# Patient Record
Sex: Female | Born: 1975 | Race: White | Hispanic: No | Marital: Single | Smoking: Current every day smoker
Health system: Southern US, Community
[De-identification: ages and names within clinical notes are randomized; demographics above are authoritative.]

## PROBLEM LIST (undated history)

## (undated) DIAGNOSIS — F1020 Alcohol dependence, uncomplicated: Secondary | ICD-10-CM

## (undated) DIAGNOSIS — K219 Gastro-esophageal reflux disease without esophagitis: Secondary | ICD-10-CM

## (undated) DIAGNOSIS — F329 Major depressive disorder, single episode, unspecified: Secondary | ICD-10-CM

## (undated) DIAGNOSIS — K2270 Barrett's esophagus without dysplasia: Secondary | ICD-10-CM

## (undated) DIAGNOSIS — F32A Depression, unspecified: Secondary | ICD-10-CM

## (undated) HISTORY — PX: CHOLECYSTECTOMY: SHX55

## (undated) HISTORY — PX: TONSILLECTOMY: SUR1361

## (undated) HISTORY — PX: NOSE SURGERY: SHX723

---

## 1998-11-02 ENCOUNTER — Other Ambulatory Visit: Admission: RE | Admit: 1998-11-02 | Discharge: 1998-11-02 | Payer: Self-pay | Admitting: Obstetrics & Gynecology

## 1998-12-08 ENCOUNTER — Inpatient Hospital Stay (HOSPITAL_COMMUNITY): Admission: AD | Admit: 1998-12-08 | Discharge: 1998-12-11 | Payer: Self-pay | Admitting: Obstetrics & Gynecology

## 1998-12-22 ENCOUNTER — Emergency Department (HOSPITAL_COMMUNITY): Admission: EM | Admit: 1998-12-22 | Discharge: 1998-12-22 | Payer: Self-pay | Admitting: Emergency Medicine

## 1998-12-30 ENCOUNTER — Emergency Department (HOSPITAL_COMMUNITY): Admission: EM | Admit: 1998-12-30 | Discharge: 1998-12-30 | Payer: Self-pay | Admitting: Emergency Medicine

## 1999-03-16 ENCOUNTER — Emergency Department (HOSPITAL_COMMUNITY): Admission: EM | Admit: 1999-03-16 | Discharge: 1999-03-16 | Payer: Self-pay | Admitting: Emergency Medicine

## 2001-11-26 ENCOUNTER — Emergency Department (HOSPITAL_COMMUNITY): Admission: EM | Admit: 2001-11-26 | Discharge: 2001-11-26 | Payer: Self-pay | Admitting: Emergency Medicine

## 2001-11-26 ENCOUNTER — Encounter: Payer: Self-pay | Admitting: Emergency Medicine

## 2002-05-24 ENCOUNTER — Emergency Department (HOSPITAL_COMMUNITY): Admission: EM | Admit: 2002-05-24 | Discharge: 2002-05-24 | Payer: Self-pay | Admitting: Emergency Medicine

## 2002-10-17 ENCOUNTER — Ambulatory Visit (HOSPITAL_COMMUNITY): Admission: RE | Admit: 2002-10-17 | Discharge: 2002-10-17 | Payer: Self-pay | Admitting: *Deleted

## 2003-09-29 ENCOUNTER — Emergency Department (HOSPITAL_COMMUNITY): Admission: EM | Admit: 2003-09-29 | Discharge: 2003-09-29 | Payer: Self-pay | Admitting: Family Medicine

## 2003-11-07 ENCOUNTER — Encounter: Admission: RE | Admit: 2003-11-07 | Discharge: 2003-11-07 | Payer: Self-pay | Admitting: *Deleted

## 2003-11-09 ENCOUNTER — Emergency Department (HOSPITAL_COMMUNITY): Admission: EM | Admit: 2003-11-09 | Discharge: 2003-11-09 | Payer: Self-pay | Admitting: Family Medicine

## 2003-11-18 ENCOUNTER — Encounter (INDEPENDENT_AMBULATORY_CARE_PROVIDER_SITE_OTHER): Payer: Self-pay | Admitting: Specialist

## 2003-11-18 ENCOUNTER — Ambulatory Visit (HOSPITAL_COMMUNITY): Admission: RE | Admit: 2003-11-18 | Discharge: 2003-11-18 | Payer: Self-pay | Admitting: *Deleted

## 2003-12-27 ENCOUNTER — Emergency Department (HOSPITAL_COMMUNITY): Admission: EM | Admit: 2003-12-27 | Discharge: 2003-12-27 | Payer: Self-pay | Admitting: Emergency Medicine

## 2003-12-31 ENCOUNTER — Encounter: Admission: RE | Admit: 2003-12-31 | Discharge: 2003-12-31 | Payer: Self-pay | Admitting: Internal Medicine

## 2006-01-07 ENCOUNTER — Emergency Department (HOSPITAL_COMMUNITY): Admission: EM | Admit: 2006-01-07 | Discharge: 2006-01-07 | Payer: Self-pay | Admitting: Emergency Medicine

## 2006-04-10 ENCOUNTER — Emergency Department (HOSPITAL_COMMUNITY): Admission: EM | Admit: 2006-04-10 | Discharge: 2006-04-10 | Payer: Self-pay | Admitting: Emergency Medicine

## 2006-09-23 ENCOUNTER — Inpatient Hospital Stay (HOSPITAL_COMMUNITY): Admission: AD | Admit: 2006-09-23 | Discharge: 2006-09-27 | Payer: Self-pay | Admitting: Psychiatry

## 2006-09-23 ENCOUNTER — Ambulatory Visit: Payer: Self-pay | Admitting: Psychiatry

## 2006-10-12 ENCOUNTER — Emergency Department (HOSPITAL_COMMUNITY): Admission: EM | Admit: 2006-10-12 | Discharge: 2006-10-12 | Payer: Self-pay | Admitting: Family Medicine

## 2007-03-10 ENCOUNTER — Emergency Department (HOSPITAL_COMMUNITY): Admission: EM | Admit: 2007-03-10 | Discharge: 2007-03-10 | Payer: Self-pay | Admitting: Emergency Medicine

## 2007-04-15 ENCOUNTER — Emergency Department (HOSPITAL_COMMUNITY): Admission: EM | Admit: 2007-04-15 | Discharge: 2007-04-15 | Payer: Self-pay | Admitting: Emergency Medicine

## 2007-10-21 ENCOUNTER — Emergency Department (HOSPITAL_COMMUNITY): Admission: EM | Admit: 2007-10-21 | Discharge: 2007-10-21 | Payer: Self-pay | Admitting: Family Medicine

## 2008-02-05 ENCOUNTER — Emergency Department (HOSPITAL_COMMUNITY): Admission: EM | Admit: 2008-02-05 | Discharge: 2008-02-05 | Payer: Self-pay | Admitting: Family Medicine

## 2008-03-09 IMAGING — CR DG CHEST 2V
2 series · 2 of 2 positions shown · non-contrast
Comparison: 03/10/2007.

PA and lateral chest, 04/15/2007.
INDICATION: Fever , vomiting.

[w chest pa]
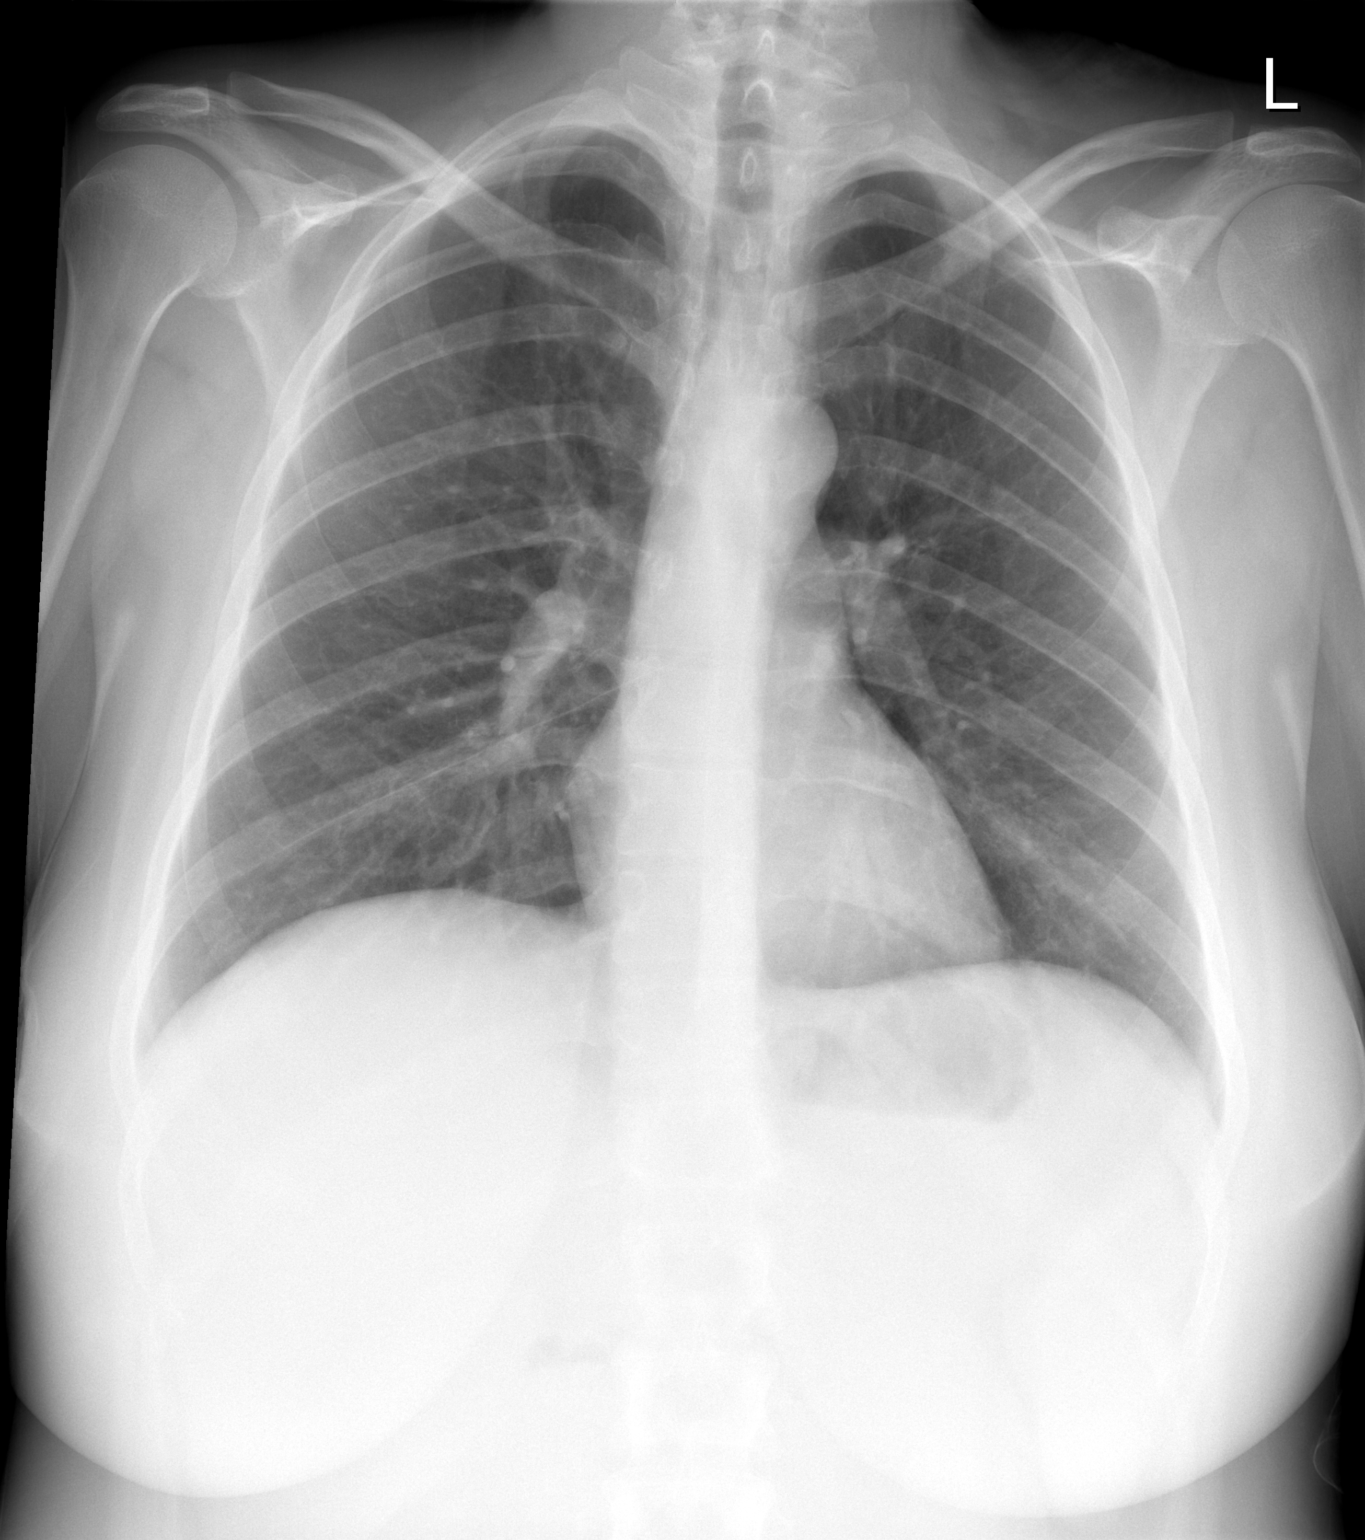

[w chest lat]
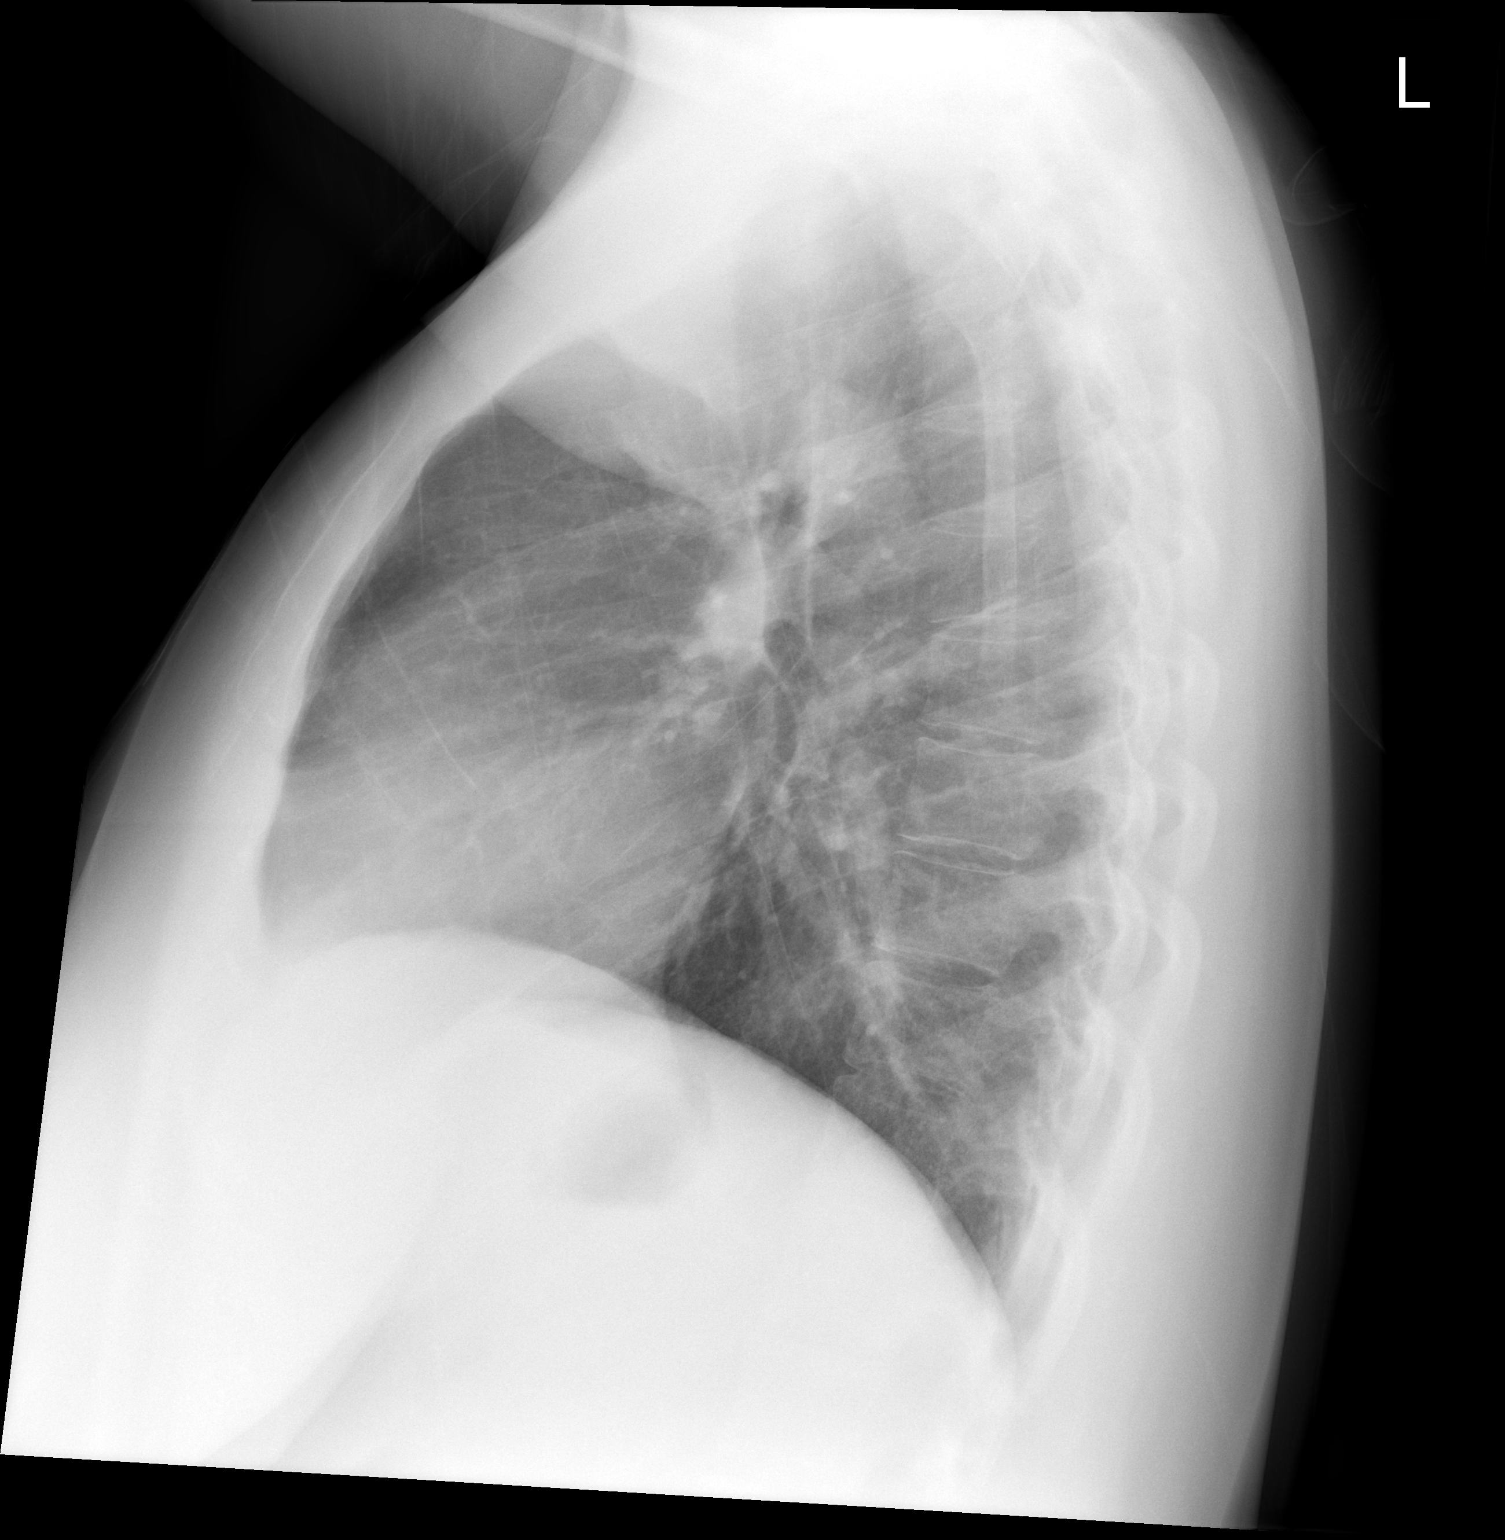

[2 of 2 positions shown; findings below may reference images not displayed]

FINDINGS: Lungs clear. Cardiopericardial silhouette within normal limits. No
edema, effusions, or airspace disease.
IMPRESSION: 1. No active cardiopulmonary disease.

## 2008-07-16 ENCOUNTER — Emergency Department (HOSPITAL_COMMUNITY): Admission: EM | Admit: 2008-07-16 | Discharge: 2008-07-16 | Payer: Self-pay | Admitting: Family Medicine

## 2009-01-29 ENCOUNTER — Emergency Department (HOSPITAL_COMMUNITY): Admission: EM | Admit: 2009-01-29 | Discharge: 2009-01-29 | Payer: Self-pay | Admitting: Emergency Medicine

## 2009-02-10 ENCOUNTER — Emergency Department (HOSPITAL_COMMUNITY): Admission: EM | Admit: 2009-02-10 | Discharge: 2009-02-10 | Payer: Self-pay | Admitting: Emergency Medicine

## 2009-05-04 ENCOUNTER — Emergency Department (HOSPITAL_COMMUNITY): Admission: EM | Admit: 2009-05-04 | Discharge: 2009-05-04 | Payer: Self-pay | Admitting: Emergency Medicine

## 2009-05-05 ENCOUNTER — Emergency Department (HOSPITAL_COMMUNITY): Admission: EM | Admit: 2009-05-05 | Discharge: 2009-05-05 | Payer: Self-pay | Admitting: Emergency Medicine

## 2009-05-24 ENCOUNTER — Emergency Department (HOSPITAL_BASED_OUTPATIENT_CLINIC_OR_DEPARTMENT_OTHER): Admission: EM | Admit: 2009-05-24 | Discharge: 2009-05-24 | Payer: Self-pay | Admitting: Emergency Medicine

## 2009-06-29 ENCOUNTER — Emergency Department (HOSPITAL_COMMUNITY): Admission: EM | Admit: 2009-06-29 | Discharge: 2009-06-29 | Payer: Self-pay | Admitting: Emergency Medicine

## 2009-07-11 ENCOUNTER — Emergency Department (HOSPITAL_COMMUNITY): Admission: EM | Admit: 2009-07-11 | Discharge: 2009-07-12 | Payer: Self-pay | Admitting: Emergency Medicine

## 2009-07-12 ENCOUNTER — Inpatient Hospital Stay: Payer: Self-pay | Admitting: Psychiatry

## 2009-11-11 ENCOUNTER — Emergency Department (HOSPITAL_COMMUNITY): Admission: EM | Admit: 2009-11-11 | Discharge: 2009-11-12 | Payer: Self-pay | Admitting: Emergency Medicine

## 2009-11-12 ENCOUNTER — Ambulatory Visit: Payer: Self-pay | Admitting: Psychiatry

## 2009-11-12 ENCOUNTER — Inpatient Hospital Stay (HOSPITAL_COMMUNITY): Admission: EM | Admit: 2009-11-12 | Discharge: 2009-11-21 | Payer: Self-pay | Admitting: Psychiatry

## 2009-12-28 ENCOUNTER — Ambulatory Visit: Payer: Self-pay | Admitting: Psychiatry

## 2010-01-13 ENCOUNTER — Emergency Department (HOSPITAL_BASED_OUTPATIENT_CLINIC_OR_DEPARTMENT_OTHER): Admission: EM | Admit: 2010-01-13 | Discharge: 2010-01-13 | Payer: Self-pay | Admitting: Emergency Medicine

## 2010-06-12 ENCOUNTER — Encounter: Payer: Self-pay | Admitting: Internal Medicine

## 2010-06-13 ENCOUNTER — Encounter: Payer: Self-pay | Admitting: Internal Medicine

## 2010-08-06 LAB — URINALYSIS, ROUTINE W REFLEX MICROSCOPIC
Glucose, UA: NEGATIVE mg/dL
Ketones, ur: 40 mg/dL — AB
Leukocytes, UA: NEGATIVE
Specific Gravity, Urine: 1.019 (ref 1.005–1.030)
Urobilinogen, UA: 0.2 mg/dL (ref 0.0–1.0)
pH: 8.5 — ABNORMAL HIGH (ref 5.0–8.0)

## 2010-08-06 LAB — BASIC METABOLIC PANEL
Calcium: 8.6 mg/dL (ref 8.4–10.5)
Creatinine, Ser: 0.8 mg/dL (ref 0.4–1.2)
GFR calc Af Amer: >60 mL/min (ref >60)
GFR calc non Af Amer: >60 mL/min (ref >60)
Glucose, Bld: 61 mg/dL — ABNORMAL LOW (ref 70–99)

## 2010-08-06 LAB — CBC
Hemoglobin: 15.3 g/dL — ABNORMAL HIGH (ref 12.0–15.0)
MCH: 33 pg (ref 26.0–34.0)
MCHC: 34.4 g/dL (ref 30.0–36.0)
MCV: 96 fL (ref 78.0–100.0)
RBC: 4.62 MIL/uL (ref 3.87–5.11)
RDW: 13.3 % (ref 11.5–15.5)

## 2010-08-06 LAB — POCT TOXICOLOGY PANEL

## 2010-08-06 LAB — DIFFERENTIAL
Basophils Relative: 1 % (ref 0–1)
Lymphocytes Relative: 17 % (ref 12–46)
Monocytes Relative: 6 % (ref 3–12)
Neutro Abs: 10.3 10*3/uL — ABNORMAL HIGH (ref 1.7–7.7)

## 2010-08-06 LAB — URINE MICROSCOPIC-ADD ON

## 2010-08-06 LAB — PREGNANCY, URINE: Preg Test, Ur: NEGATIVE

## 2010-08-08 LAB — CBC
Hemoglobin: 14.1 g/dL (ref 12.0–15.0)
Hemoglobin: 14.2 g/dL (ref 12.0–15.0)
MCV: 97.7 fL (ref 78.0–100.0)
MCV: 96.6 fL (ref 78.0–100.0)
Platelets: 256 10*3/uL (ref 150–400)
RBC: 4.46 MIL/uL (ref 3.87–5.11)
RDW: 12.7 % (ref 11.5–15.5)
RDW: 13.3 % (ref 11.5–15.5)

## 2010-08-08 LAB — BASIC METABOLIC PANEL
BUN: 7 mg/dL (ref 6–23)
BUN: 2 mg/dL — ABNORMAL LOW (ref 6–23)
CO2: 30 mEq/L (ref 19–32)
Calcium: 9.7 mg/dL (ref 8.4–10.5)
Calcium: 8.8 mg/dL (ref 8.4–10.5)
Chloride: 96 mEq/L (ref 96–112)
GFR calc non Af Amer: >60 mL/min (ref >60)
GFR calc non Af Amer: >60 mL/min (ref >60)
Glucose, Bld: 84 mg/dL (ref 70–99)
Potassium: 2.3 mEq/L — CL (ref 3.5–5.1)
Potassium: 2.5 mEq/L — CL (ref 3.5–5.1)
Sodium: 140 mEq/L (ref 135–145)
Sodium: 138 mEq/L (ref 135–145)

## 2010-08-08 LAB — URINALYSIS, ROUTINE W REFLEX MICROSCOPIC
Glucose, UA: NEGATIVE mg/dL
Ketones, ur: 40 mg/dL — AB
Protein, ur: 30 mg/dL — AB
Urobilinogen, UA: 1 mg/dL (ref 0.0–1.0)
pH: 7.5 (ref 5.0–8.0)

## 2010-08-08 LAB — HEPATIC FUNCTION PANEL
AST: 27 U/L (ref 0–37)
Bilirubin, Direct: 0.2 mg/dL (ref 0.0–0.3)
Total Bilirubin: 0.6 mg/dL (ref 0.3–1.2)

## 2010-08-08 LAB — DIFFERENTIAL
Basophils Absolute: 0 10*3/uL (ref 0.0–0.1)
Basophils Relative: 1 % (ref 0–1)
Eosinophils Absolute: 0 10*3/uL (ref 0.0–0.7)
Lymphocytes Relative: 41 % (ref 12–46)
Lymphs Abs: 2.5 10*3/uL (ref 0.7–4.0)
Monocytes Absolute: 0.4 10*3/uL (ref 0.1–1.0)
Monocytes Absolute: 0.4 10*3/uL (ref 0.1–1.0)
Monocytes Relative: 7 % (ref 3–12)
Neutro Abs: 4.7 10*3/uL (ref 1.7–7.7)
Neutro Abs: 3.3 10*3/uL (ref 1.7–7.7)
Neutrophils Relative %: 64 % (ref 43–77)

## 2010-08-08 LAB — URINE MICROSCOPIC-ADD ON

## 2010-08-08 LAB — GC/CHLAMYDIA PROBE AMP, URINE: GC Probe Amp, Urine: NEGATIVE

## 2010-08-08 LAB — TSH
TSH: 0.617 u[IU]/mL (ref 0.350–4.500)
TSH: 1.211 u[IU]/mL (ref 0.350–4.500)

## 2010-08-08 LAB — POTASSIUM
Potassium: 3 mEq/L — ABNORMAL LOW (ref 3.5–5.1)
Potassium: 3.3 mEq/L — ABNORMAL LOW (ref 3.5–5.1)

## 2010-08-08 LAB — ACETAMINOPHEN LEVEL: Acetaminophen (Tylenol), Serum: <10 ug/mL — ABNORMAL LOW (ref 10–30)

## 2010-08-08 LAB — TRICYCLICS SCREEN, URINE: TCA Scrn: NOT DETECTED

## 2010-08-08 LAB — RAPID URINE DRUG SCREEN, HOSP PERFORMED
Benzodiazepines: POSITIVE — AB
Tetrahydrocannabinol: POSITIVE — AB

## 2010-08-08 LAB — ETHANOL: Alcohol, Ethyl (B): <5 mg/dL (ref 0–10)

## 2010-08-08 LAB — POCT PREGNANCY, URINE: Preg Test, Ur: NEGATIVE

## 2010-08-08 LAB — BLOOD GAS, ARTERIAL: TCO2: 26.1 mmol/L (ref 0–100)

## 2010-08-11 LAB — CBC
HCT: 41.9 % (ref 36.0–46.0)
HCT: 46.6 % — ABNORMAL HIGH (ref 36.0–46.0)
Hemoglobin: 14.9 g/dL (ref 12.0–15.0)
MCHC: 34.6 g/dL (ref 30.0–36.0)
MCHC: 35.5 g/dL (ref 30.0–36.0)
MCV: 97 fL (ref 78.0–100.0)
MCV: 98.3 fL (ref 78.0–100.0)
Platelets: 226 10*3/uL (ref 150–400)
RBC: 4.74 MIL/uL (ref 3.87–5.11)
RDW: 14.5 % (ref 11.5–15.5)
WBC: 7.1 10*3/uL (ref 4.0–10.5)

## 2010-08-11 LAB — DIFFERENTIAL
Basophils Relative: 1 % (ref 0–1)
Eosinophils Absolute: 0 10*3/uL (ref 0.0–0.7)
Eosinophils Relative: 0 % (ref 0–5)
Lymphs Abs: 2.8 10*3/uL (ref 0.7–4.0)
Monocytes Relative: 5 % (ref 3–12)

## 2010-08-11 LAB — LIPASE, BLOOD: Lipase: 61 U/L — ABNORMAL HIGH (ref 11–59)

## 2010-08-11 LAB — COMPREHENSIVE METABOLIC PANEL
Albumin: 3.9 g/dL (ref 3.5–5.2)
Alkaline Phosphatase: 46 U/L (ref 39–117)
BUN: 7 mg/dL (ref 6–23)
Creatinine, Ser: 0.59 mg/dL (ref 0.4–1.2)
Glucose, Bld: 90 mg/dL (ref 70–99)
Potassium: 2.2 mEq/L — CL (ref 3.5–5.1)
Total Bilirubin: 1 mg/dL (ref 0.3–1.2)
Total Protein: 7.3 g/dL (ref 6.0–8.3)

## 2010-08-11 LAB — RAPID URINE DRUG SCREEN, HOSP PERFORMED
Barbiturates: NOT DETECTED
Barbiturates: NOT DETECTED
Benzodiazepines: NOT DETECTED
Benzodiazepines: POSITIVE — AB
Cocaine: NOT DETECTED
Opiates: NOT DETECTED
Opiates: NOT DETECTED

## 2010-08-11 LAB — URINALYSIS, ROUTINE W REFLEX MICROSCOPIC
Glucose, UA: NEGATIVE mg/dL
Ketones, ur: 40 mg/dL — AB
Protein, ur: NEGATIVE mg/dL
pH: 6 (ref 5.0–8.0)

## 2010-08-11 LAB — POCT I-STAT, CHEM 8
Calcium, Ion: 0.98 mmol/L — ABNORMAL LOW (ref 1.12–1.32)
Creatinine, Ser: 0.7 mg/dL (ref 0.4–1.2)
Glucose, Bld: 88 mg/dL (ref 70–99)
Hemoglobin: 16.7 g/dL — ABNORMAL HIGH (ref 12.0–15.0)
TCO2: 23 mmol/L (ref 0–100)

## 2010-08-11 LAB — URINE MICROSCOPIC-ADD ON

## 2010-08-11 LAB — ETHANOL: Alcohol, Ethyl (B): 5 mg/dL (ref 0–10)

## 2010-08-27 LAB — POCT I-STAT, CHEM 8
BUN: <3 mg/dL — ABNORMAL LOW (ref 6–23)
BUN: <3 mg/dL — ABNORMAL LOW (ref 6–23)
Calcium, Ion: 1.07 mmol/L — ABNORMAL LOW (ref 1.12–1.32)
Calcium, Ion: 0.91 mmol/L — ABNORMAL LOW (ref 1.12–1.32)
Calcium, Ion: 0.94 mmol/L — ABNORMAL LOW (ref 1.12–1.32)
Chloride: 91 mEq/L — ABNORMAL LOW (ref 96–112)
Chloride: 90 mEq/L — ABNORMAL LOW (ref 96–112)
Creatinine, Ser: 0.6 mg/dL (ref 0.4–1.2)
Creatinine, Ser: 0.7 mg/dL (ref 0.4–1.2)
Glucose, Bld: 95 mg/dL (ref 70–99)
Glucose, Bld: 141 mg/dL — ABNORMAL HIGH (ref 70–99)
Glucose, Bld: 89 mg/dL (ref 70–99)
HCT: 45 % (ref 36.0–46.0)
Hemoglobin: 15.3 g/dL — ABNORMAL HIGH (ref 12.0–15.0)
TCO2: 34 mmol/L (ref 0–100)

## 2010-08-27 LAB — CBC
HCT: 41.3 % (ref 36.0–46.0)
Hemoglobin: 15.9 g/dL — ABNORMAL HIGH (ref 12.0–15.0)
MCHC: 34.8 g/dL (ref 30.0–36.0)
MCHC: 34.5 g/dL (ref 30.0–36.0)
MCV: 99.5 fL (ref 78.0–100.0)
Platelets: 181 10*3/uL (ref 150–400)
RBC: 4.62 MIL/uL (ref 3.87–5.11)
RDW: 15.1 % (ref 11.5–15.5)

## 2010-08-27 LAB — POCT CARDIAC MARKERS
CKMB, poc: 5.9 ng/mL (ref 1.0–8.0)
Myoglobin, poc: 138 ng/mL (ref 12–200)
Troponin i, poc: <0.05 ng/mL (ref 0.00–0.09)

## 2010-08-27 LAB — URINE MICROSCOPIC-ADD ON

## 2010-08-27 LAB — SALICYLATE LEVEL: Salicylate Lvl: <4 mg/dL (ref 2.8–20.0)

## 2010-08-27 LAB — URINALYSIS, ROUTINE W REFLEX MICROSCOPIC
Bilirubin Urine: NEGATIVE
Glucose, UA: NEGATIVE mg/dL
Hgb urine dipstick: NEGATIVE
Ketones, ur: 15 mg/dL — AB
Ketones, ur: NEGATIVE mg/dL
Nitrite: NEGATIVE
Protein, ur: 100 mg/dL — AB
Specific Gravity, Urine: 1.016 (ref 1.005–1.030)
Urobilinogen, UA: 1 mg/dL (ref 0.0–1.0)

## 2010-08-27 LAB — DIFFERENTIAL
Basophils Absolute: 0 10*3/uL (ref 0.0–0.1)
Basophils Absolute: 0.1 10*3/uL (ref 0.0–0.1)
Basophils Relative: 0 % (ref 0–1)
Basophils Relative: 1 % (ref 0–1)
Eosinophils Absolute: 0.1 10*3/uL (ref 0.0–0.7)
Eosinophils Relative: 0 % (ref 0–5)
Lymphocytes Relative: 12 % (ref 12–46)
Lymphs Abs: 5.6 10*3/uL — ABNORMAL HIGH (ref 0.7–4.0)
Neutro Abs: 8.5 10*3/uL — ABNORMAL HIGH (ref 1.7–7.7)
Neutrophils Relative %: 59 % (ref 43–77)

## 2010-08-27 LAB — D-DIMER, QUANTITATIVE: D-Dimer, Quant: 0.45 ug/mL-FEU (ref 0.00–0.48)

## 2010-08-27 LAB — URINE CULTURE: Colony Count: NO GROWTH

## 2010-08-27 LAB — COMPREHENSIVE METABOLIC PANEL
ALT: <8 U/L (ref 0–35)
CO2: 30 mEq/L (ref 19–32)
Calcium: 9.6 mg/dL (ref 8.4–10.5)
GFR calc non Af Amer: >60 mL/min (ref >60)
Glucose, Bld: 138 mg/dL — ABNORMAL HIGH (ref 70–99)
Sodium: 143 mEq/L (ref 135–145)

## 2010-08-27 LAB — LIPASE, BLOOD: Lipase: 36 U/L (ref 11–59)

## 2010-08-27 LAB — ETHANOL: Alcohol, Ethyl (B): <5 mg/dL (ref 0–10)

## 2010-08-27 LAB — RAPID URINE DRUG SCREEN, HOSP PERFORMED
Amphetamines: NOT DETECTED
Benzodiazepines: POSITIVE — AB
Cocaine: NOT DETECTED
Opiates: NOT DETECTED
Tetrahydrocannabinol: NOT DETECTED
Tetrahydrocannabinol: POSITIVE — AB

## 2010-08-27 LAB — CK TOTAL AND CKMB (NOT AT ARMC): Relative Index: 0.9 (ref 0.0–2.5)

## 2010-10-05 NOTE — Discharge Summary (Signed)
NAMEMarland Kitchen  HOLLIDAY, SHEAFFER          ACCOUNT NO.:  [id number]   MEDICAL RECORD NO.:  [id number]          PATIENT TYPE:  IPS   LOCATION:  0307                          FACILITY:  [facility]   PHYSICIAN:  Anselm Jungling, MD  DATE OF BIRTH:  11-13-1975   DATE OF ADMISSION:  09/23/2006  DATE OF DISCHARGE:  09/27/2006                               DISCHARGE SUMMARY   IDENTIFYING DATA AND REASON FOR ADMISSION:  The patient is a 35 year old  single white female who initially presented to the emergency department  at [facility] in the company of the [facility]  Department.  Her son had called 9-1-1.  She had been drinking alcohol  heavily, and her alcohol level on admission was 267.  She had scratched  her left wrist few days prior and all of this was frightening to her  son.  She had a DWI a year prior and her license had been revoked.  Please refer to the admission note for further details pertaining to the  symptoms, circumstances and history that led to her hospitalization.   She was given initial Axis I diagnosis of polysubstance dependence, and  depressive disorder, not otherwise specified.   MEDICAL AND LABORATORY:  The patient was medically and physically  assessed both in the emergency room, and by our psychiatric nurse  practitioner upon admission to the inpatient service.  She was in  generally good health, with the exception of hiatal hernia.  There were  no acute medical issues.   HOSPITAL COURSE:  The patient was admitted to the adult inpatient  psychiatric service.  She presented as a well-nourished, well-developed  woman who was alert, fully oriented and without any signs, or symptoms  of psychosis or thought disorder.   She was placed on a Librium withdrawal protocol to assist with alcohol  cessation.  In addition, she was started on Campral  666 mg t.i.d. for  alcohol cessation.  She was placed briefly on a trial of Lamictal 25 mg  daily, but this was  discontinued after 1 day, as it did not appear to be  in the best interest of the overall treatment plan.   The patient was involved in various therapeutic groups and activities,  including those geared towards 12-step recovery.  She missed groups on  the third hospital day due to her complaints of nausea, vomiting and  abdominal cramping with diarrhea.  It was in part the symptoms that led  to the discontinuation of Lamictal, which might have been in part  responsible for them.   On the third hospital day, there was also a family session with the  patient's parents.  Discharge planning and aftercare needs were  discussed at length.  The patient's family was generally supportive.   The patient continued with better participation over the past 3 days of  her hospital stay.  She completed the Librium protocol and from then on,  did not receive any further Librium.   She appeared appropriate for discharge on the fifth hospital day.  She  agreed to the following aftercare plan.   AFTERCARE:  The patient was  to follow-up at the [facility], and was  to go there for intake directly following her discharge.  She was also  given referral information for [facility].   DISCHARGE MEDICATIONS:  Campral 666 mg t.i.d.   DISCHARGE DIAGNOSES:  AXIS I: Alcohol dependence, early remission.  AXIS II: Deferred.  AXIS III: History of hiatal hernia.  AXIS IV: Stressors severe.  AXIS V: GAF on discharge 65.      Anselm Jungling, MD  Electronically Signed     SPB/MEDQ  D:  10/02/2006  T:  10/02/2006  Job:  [id number]

## 2010-10-08 NOTE — Op Note (Signed)
NAME:  Melody Alexander, Melody Alexander                    ACCOUNT NO.:  [id number]   MEDICAL RECORD NO.:  [id number]                   PATIENT TYPE:  AMB   LOCATION:  ENDO                                 FACILITY:  [facility]   PHYSICIAN:  Sharyn Dross., M.D.               DATE OF BIRTH:  1975-06-27   DATE OF PROCEDURE:  10/17/2002  DATE OF DISCHARGE:                                 OPERATIVE REPORT   PREOPERATIVE DIAGNOSIS:  Gastroesophageal reflux disease; rule out erosive  esophagitis, rule out possible Barrett's esophagus.   POSTOPERATIVE DIAGNOSIS:  1. Grade 1 LA classification (A) esophagitis.  2. Lax lower esophageal sphincter area.   PROCEDURE:  Esophagogastroduodenoscopy.   ENDOSCOPIST:  Dortha Kern, M.D.   MEDICATIONS:  Demerol 100 mg IV, Versed 10 mg IV over a 10 minute period of  time.   INSTRUMENTS:  Olympus video panendoscope.   INFORMED CONSENT:  The patient was advised of the procedure, indications and  the risks involved.  The video was reviewed and consent form obtained.   DESCRIPTION OF PROCEDURE:  The patient was brought to the [facility]  Endoscopy Unit where the IV sedating medication was started.  A monitor was  placed on the patient to monitor the patient's vital signs and oxygen  saturation.  Nasal oxygen at 2 liters per minute was used, and after  adequate sedation was performed, the procedure was begun.   The instrument was advanced with the patient lying in the left lateral  position under direct technique without difficulty.  The oropharyngeal,  epiglottis and vocal cords appeared to be within normal limits.  The  esophagus was normal without any evidence of acute inflammation, ulcerations  or varices appreciated.  There was a relaxed lower esophageal sphincter area  that was noted.  At the cardia region, there was noticed evidence of LA  classification, grade A esophagitis that was present at this time.  There  were no other abnormalities appreciated  otherwise.   The gastric area showed a normal mucous lake without any evidence of acute  inflammation or ulceration that was noted.  The antral area was normal, and  the pylorus appeared to be normal without any deformities appreciated.  On  advancing to the pyloric canal, the duodenal bulb and second portion  appeared to be within normal limits.  Photographs were taken of these  regions.   The instrument was retracted back.  A retroflexed view of the cardia showed  relaxed lower esophageal sphincter area that was present.  The instrument  was retracted back.  The Z-line appeared to be 34 cm distal esophagus that  was noted.  The instrument was again retracted back into the esophageal area  where a closer view of the inflammatory lesions in the cardia were noted.  Again, photographs were taken of this region.  The instrument was  subsequently retracted back into the esophagus and removed per ora without  difficulty where  the patient tolerated the procedure well.   TREATMENT:  1. Continue the present PPI b.i.d.  2. Will add Reglan 10 mg q.12h. to the regimen.  3. We will have the patient follow up with me in two weeks for re-evaluation     and to discuss the results of the procedure.                                               Sharyn Dross., M.D.    JM/MEDQ  D:  10/17/2002  T:  10/17/2002  Job:  [id number]
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
## 2011-02-11 ENCOUNTER — Emergency Department (HOSPITAL_COMMUNITY)
Admission: EM | Admit: 2011-02-11 | Discharge: 2011-02-11 | Disposition: A | Discharge status: Home / Self Care | Payer: Medicaid Other | Attending: Emergency Medicine | Admitting: Emergency Medicine

## 2011-02-11 DIAGNOSIS — R259 Unspecified abnormal involuntary movements: Secondary | ICD-10-CM | POA: Insufficient documentation

## 2011-02-11 DIAGNOSIS — R252 Cramp and spasm: Secondary | ICD-10-CM | POA: Insufficient documentation

## 2011-02-11 DIAGNOSIS — K219 Gastro-esophageal reflux disease without esophagitis: Secondary | ICD-10-CM | POA: Insufficient documentation

## 2011-02-11 DIAGNOSIS — R112 Nausea with vomiting, unspecified: Secondary | ICD-10-CM | POA: Insufficient documentation

## 2011-02-11 LAB — POCT I-STAT, CHEM 8
BUN: 12 mg/dL (ref 6–23)
Calcium, Ion: 1.13 mmol/L (ref 1.12–1.32)
Chloride: 106 mEq/L (ref 96–112)
Glucose, Bld: 89 mg/dL (ref 70–99)
HCT: 46 % (ref 36.0–46.0)
Potassium: 3.5 mEq/L (ref 3.5–5.1)

## 2011-02-11 LAB — CBC
HCT: 39.5 % (ref 36.0–46.0)
MCV: 86.8 fL (ref 78.0–100.0)
RBC: 4.55 MIL/uL (ref 3.87–5.11)
RDW: 12.8 % (ref 11.5–15.5)
WBC: 17 10*3/uL — ABNORMAL HIGH (ref 4.0–10.5)

## 2011-02-11 LAB — DIFFERENTIAL
Basophils Absolute: 0.1 10*3/uL (ref 0.0–0.1)
Eosinophils Relative: 0 % (ref 0–5)
Lymphocytes Relative: 11 % — ABNORMAL LOW (ref 12–46)
Lymphs Abs: 1.9 10*3/uL (ref 0.7–4.0)
Neutro Abs: 14.4 10*3/uL — ABNORMAL HIGH (ref 1.7–7.7)
Neutrophils Relative %: 84 % — ABNORMAL HIGH (ref 43–77)

## 2011-02-11 LAB — POCT PREGNANCY, URINE: Preg Test, Ur: NEGATIVE

## 2011-02-11 LAB — URINE MICROSCOPIC-ADD ON

## 2011-02-11 LAB — URINALYSIS, ROUTINE W REFLEX MICROSCOPIC
Hgb urine dipstick: NEGATIVE
Nitrite: NEGATIVE
Specific Gravity, Urine: 1.018 (ref 1.005–1.030)
Urobilinogen, UA: 0.2 mg/dL (ref 0.0–1.0)
pH: 7 (ref 5.0–8.0)

## 2011-02-16 LAB — POCT URINALYSIS DIP (DEVICE)
Glucose, UA: NEGATIVE
Specific Gravity, Urine: 1.025
pH: 6

## 2011-02-16 LAB — WET PREP, GENITAL: Clue Cells Wet Prep HPF POC: NONE SEEN

## 2011-02-16 LAB — HEPATITIS B CORE ANTIBODY, TOTAL: Hep B Core Total Ab: NEGATIVE

## 2011-02-16 LAB — HEPATITIS B SURFACE ANTIGEN: Hepatitis B Surface Ag: NEGATIVE

## 2011-02-16 LAB — GC/CHLAMYDIA PROBE AMP, GENITAL
Chlamydia, DNA Probe: POSITIVE — AB
GC Probe Amp, Genital: NEGATIVE

## 2011-02-16 LAB — HEPATITIS C ANTIBODY: HCV Ab: NEGATIVE

## 2011-02-16 LAB — URINE CULTURE

## 2011-02-16 LAB — HEPATITIS B SURFACE ANTIBODY,QUALITATIVE: Hep B S Ab: POSITIVE — AB

## 2011-03-01 LAB — I-STAT 8, (EC8 V) (CONVERTED LAB)
BUN: 6
Bicarbonate: 28.5 — ABNORMAL HIGH
Glucose, Bld: 96
Hemoglobin: 13.9
Potassium: 2.4 — CL
Sodium: 138

## 2011-03-01 LAB — CBC
HCT: 38.2
Hemoglobin: 13
MCHC: 34.1
MCV: 94.7
Platelets: 335
RBC: 4.03
RDW: 12.7
WBC: 11.2 — ABNORMAL HIGH

## 2011-03-01 LAB — URINALYSIS, ROUTINE W REFLEX MICROSCOPIC
Bilirubin Urine: NEGATIVE
Glucose, UA: NEGATIVE
Ketones, ur: 40 — AB
Nitrite: POSITIVE — AB
Protein, ur: 30 — AB
Specific Gravity, Urine: 1.013
Urobilinogen, UA: 2 — ABNORMAL HIGH
pH: 6

## 2011-03-01 LAB — DIFFERENTIAL
Basophils Absolute: 0
Basophils Relative: 0
Eosinophils Absolute: 0 — ABNORMAL LOW
Eosinophils Relative: 0
Lymphocytes Relative: 9 — ABNORMAL LOW
Lymphs Abs: 1
Monocytes Absolute: 0.9
Monocytes Relative: 8
Neutro Abs: 9.4 — ABNORMAL HIGH
Neutrophils Relative %: 83 — ABNORMAL HIGH

## 2011-03-01 LAB — POCT I-STAT CREATININE
Creatinine, Ser: 0.8
Operator id: 272551

## 2011-03-01 LAB — URINE MICROSCOPIC-ADD ON

## 2011-03-01 LAB — INFLUENZA A+B VIRUS AG-DIRECT(RAPID)
Inflenza A Ag: NEGATIVE
Influenza B Ag: NEGATIVE

## 2011-03-01 LAB — URINE CULTURE: Colony Count: >=100000

## 2011-03-01 LAB — POCT PREGNANCY, URINE
Operator id: 272551
Preg Test, Ur: NEGATIVE

## 2011-04-20 ENCOUNTER — Encounter: Payer: Self-pay | Admitting: *Deleted

## 2011-04-20 ENCOUNTER — Emergency Department (HOSPITAL_COMMUNITY)
Admission: EM | Admit: 2011-04-20 | Discharge: 2011-04-21 | Disposition: A | Discharge status: Home / Self Care | Payer: Medicaid Other | Attending: Emergency Medicine | Admitting: Emergency Medicine

## 2011-04-20 DIAGNOSIS — K2270 Barrett's esophagus without dysplasia: Secondary | ICD-10-CM | POA: Insufficient documentation

## 2011-04-20 DIAGNOSIS — K219 Gastro-esophageal reflux disease without esophagitis: Secondary | ICD-10-CM | POA: Insufficient documentation

## 2011-04-20 DIAGNOSIS — F17200 Nicotine dependence, unspecified, uncomplicated: Secondary | ICD-10-CM | POA: Insufficient documentation

## 2011-04-20 DIAGNOSIS — F1010 Alcohol abuse, uncomplicated: Secondary | ICD-10-CM | POA: Insufficient documentation

## 2011-04-20 DIAGNOSIS — Z79899 Other long term (current) drug therapy: Secondary | ICD-10-CM | POA: Insufficient documentation

## 2011-04-20 DIAGNOSIS — D72829 Elevated white blood cell count, unspecified: Secondary | ICD-10-CM | POA: Insufficient documentation

## 2011-04-20 DIAGNOSIS — F1020 Alcohol dependence, uncomplicated: Secondary | ICD-10-CM

## 2011-04-20 DIAGNOSIS — T512X1A Toxic effect of 2-Propanol, accidental (unintentional), initial encounter: Secondary | ICD-10-CM | POA: Insufficient documentation

## 2011-04-20 DIAGNOSIS — R1013 Epigastric pain: Secondary | ICD-10-CM | POA: Insufficient documentation

## 2011-04-20 DIAGNOSIS — F329 Major depressive disorder, single episode, unspecified: Secondary | ICD-10-CM | POA: Insufficient documentation

## 2011-04-20 DIAGNOSIS — T512X4A Toxic effect of 2-Propanol, undetermined, initial encounter: Secondary | ICD-10-CM | POA: Insufficient documentation

## 2011-04-20 DIAGNOSIS — F3289 Other specified depressive episodes: Secondary | ICD-10-CM | POA: Insufficient documentation

## 2011-04-20 DIAGNOSIS — R112 Nausea with vomiting, unspecified: Secondary | ICD-10-CM | POA: Insufficient documentation

## 2011-04-20 HISTORY — DX: Depression, unspecified: F32.A

## 2011-04-20 HISTORY — DX: Alcohol dependence, uncomplicated: F10.20

## 2011-04-20 HISTORY — DX: Barrett's esophagus without dysplasia: K22.70

## 2011-04-20 HISTORY — DX: Major depressive disorder, single episode, unspecified: F32.9

## 2011-04-20 HISTORY — DX: Gastro-esophageal reflux disease without esophagitis: K21.9

## 2011-04-20 LAB — CBC
MCH: 30 pg (ref 26.0–34.0)
MCHC: 34.4 g/dL (ref 30.0–36.0)
MCV: 87 fL (ref 78.0–100.0)
Platelets: 306 10*3/uL (ref 150–400)

## 2011-04-20 LAB — BLOOD GAS, VENOUS
Patient temperature: 98.6
TCO2: 23.5 mmol/L (ref 0–100)
pCO2, Ven: 37.7 mmHg — ABNORMAL LOW (ref 45.0–50.0)
pH, Ven: 7.458 — ABNORMAL HIGH (ref 7.250–7.300)

## 2011-04-20 LAB — DIFFERENTIAL
Basophils Relative: 0 % (ref 0–1)
Eosinophils Absolute: 0 10*3/uL (ref 0.0–0.7)
Eosinophils Relative: 0 % (ref 0–5)
Monocytes Relative: 4 % (ref 3–12)
Neutrophils Relative %: 84 % — ABNORMAL HIGH (ref 43–77)

## 2011-04-20 LAB — PREGNANCY, URINE: Preg Test, Ur: NEGATIVE

## 2011-04-20 LAB — COMPREHENSIVE METABOLIC PANEL
Albumin: 4.4 g/dL (ref 3.5–5.2)
Alkaline Phosphatase: 64 U/L (ref 39–117)
BUN: 10 mg/dL (ref 6–23)
Calcium: 10.2 mg/dL (ref 8.4–10.5)
GFR calc Af Amer: >90 mL/min (ref >90)
Glucose, Bld: 124 mg/dL — ABNORMAL HIGH (ref 70–99)
Potassium: 2.9 mEq/L — ABNORMAL LOW (ref 3.5–5.1)
Total Protein: 8.4 g/dL — ABNORMAL HIGH (ref 6.0–8.3)

## 2011-04-20 LAB — KETONES, QUALITATIVE

## 2011-04-20 LAB — ALCOHOL,  ISOPROPYL (ISOPROPANOL)
Acetone: 0.102
Isopropanol: NOT DETECTED

## 2011-04-20 LAB — ACETAMINOPHEN LEVEL: Acetaminophen (Tylenol), Serum: <15 ug/mL (ref 10–30)

## 2011-04-20 LAB — RAPID URINE DRUG SCREEN, HOSP PERFORMED
Amphetamines: NOT DETECTED
Opiates: NOT DETECTED

## 2011-04-20 LAB — ETHANOL: Alcohol, Ethyl (B): <11 mg/dL (ref 0–11)

## 2011-04-20 LAB — SALICYLATE LEVEL: Salicylate Lvl: <2 mg/dL — ABNORMAL LOW (ref 2.8–20.0)

## 2011-04-20 MED ORDER — POTASSIUM CHLORIDE CRYS ER 20 MEQ PO TBCR
20.0000 meq | EXTENDED_RELEASE_TABLET | Freq: Two times a day (BID) | ORAL | Status: DC
  Administered 2011-04-20 – 2011-04-21 (×2): 20 meq via ORAL
  Filled 2011-04-20: qty 1

## 2011-04-20 MED ORDER — ACETAMINOPHEN 325 MG PO TABS
650.0000 mg | ORAL_TABLET | ORAL | Status: DC | PRN
  Administered 2011-04-21: 650 mg via ORAL

## 2011-04-20 MED ORDER — POTASSIUM CHLORIDE CRYS ER 20 MEQ PO TBCR
40.0000 meq | EXTENDED_RELEASE_TABLET | Freq: Once | ORAL | Status: AC
  Administered 2011-04-20: 40 meq via ORAL
  Filled 2011-04-20: qty 2

## 2011-04-20 MED ORDER — LORAZEPAM 1 MG PO TABS
1.0000 mg | ORAL_TABLET | Freq: Three times a day (TID) | ORAL | Status: DC | PRN
  Administered 2011-04-21: 1 mg via ORAL

## 2011-04-20 MED ORDER — PANTOPRAZOLE SODIUM 40 MG PO TBEC
80.0000 mg | DELAYED_RELEASE_TABLET | Freq: Every day | ORAL | Status: DC
  Administered 2011-04-20 – 2011-04-21 (×2): 80 mg via ORAL
  Filled 2011-04-20: qty 2
  Filled 2011-04-20: qty 1

## 2011-04-20 MED ORDER — SODIUM CHLORIDE 0.9 % IV BOLUS (SEPSIS)
1000.0000 mL | Freq: Once | INTRAVENOUS | Status: AC
  Administered 2011-04-20: 1000 mL via INTRAVENOUS

## 2011-04-20 MED ORDER — ONDANSETRON HCL 4 MG/2ML IJ SOLN
4.0000 mg | Freq: Once | INTRAMUSCULAR | Status: AC
  Administered 2011-04-20: 4 mg via INTRAVENOUS
  Filled 2011-04-20: qty 2

## 2011-04-20 MED ORDER — ALUM & MAG HYDROXIDE-SIMETH 200-200-20 MG/5ML PO SUSP
30.0000 mL | ORAL | Status: DC | PRN
Start: 2011-04-20 — End: 2011-04-21

## 2011-04-20 MED ORDER — CARBAMAZEPINE ER 200 MG PO CP12
200.0000 mg | ORAL_CAPSULE | Freq: Two times a day (BID) | ORAL | Status: DC
  Administered 2011-04-20 – 2011-04-21 (×2): 200 mg via ORAL
  Filled 2011-04-20 (×3): qty 1

## 2011-04-20 MED ORDER — TRAZODONE HCL 50 MG PO TABS
50.0000 mg | ORAL_TABLET | Freq: Every day | ORAL | Status: DC
  Administered 2011-04-20: 50 mg via ORAL
  Filled 2011-04-20: qty 1

## 2011-04-20 MED ORDER — PANTOPRAZOLE SODIUM 40 MG IV SOLR
40.0000 mg | Freq: Once | INTRAVENOUS | Status: AC
  Administered 2011-04-20: 40 mg via INTRAVENOUS
  Filled 2011-04-20: qty 40

## 2011-04-20 MED ORDER — ONDANSETRON HCL 4 MG PO TABS: 4.0000 mg | ORAL_TABLET | Freq: Three times a day (TID) | ORAL | Status: DC | PRN

## 2011-04-20 MED ORDER — ZOLPIDEM TARTRATE 5 MG PO TABS: 5.0000 mg | ORAL_TABLET | Freq: Every evening | ORAL | Status: DC | PRN

## 2011-04-20 MED ORDER — GABAPENTIN 100 MG PO CAPS
200.0000 mg | ORAL_CAPSULE | Freq: Three times a day (TID) | ORAL | Status: DC
  Administered 2011-04-20 – 2011-04-21 (×2): 200 mg via ORAL
  Filled 2011-04-20 (×4): qty 2

## 2011-04-20 MED ORDER — SUCRALFATE 1 G PO TABS
1.0000 g | ORAL_TABLET | Freq: Three times a day (TID) | ORAL | Status: DC
  Administered 2011-04-21 (×2): 1 g via ORAL
  Filled 2011-04-20 (×3): qty 1

## 2011-04-20 MED ORDER — PAROXETINE HCL 20 MG PO TABS
40.0000 mg | ORAL_TABLET | Freq: Every day | ORAL | Status: DC
  Administered 2011-04-21: 40 mg via ORAL
  Filled 2011-04-20: qty 2

## 2011-04-20 NOTE — ED Notes (Signed)
Pt well, no c/o pain. Say her stomach feels better, no burning at this time. Pt admits to wanting help has done rehab before and has always relapsed.

## 2011-04-20 NOTE — ED Notes (Signed)
Pt states yesterday about 5 pm, she drank 2 cups of rubbing alcohol in an attempt to get drunk.  She passed out from it for "hours" after.  C/o "burning in her esophagus" 5/10, n/v/d.  Denying any other pain/sx.  All vitals stable.  NAD.

## 2011-04-20 NOTE — ED Notes (Signed)
Pt states "I drank about 2 cups of rubbing ETOH last night trying to get drunk, I'm a recovering alcoholic, I really messed up"

## 2011-04-20 NOTE — Progress Notes (Signed)
Security here to wand pt and belongings, pt has purse, no money in purse verified with patient. Pt told that belongings would be locked up in psych ED with her clothes. Pt agrees.

## 2011-04-20 NOTE — ED Notes (Signed)
Pt moved to Psych ED. Room 37. Belongings left at nursing station

## 2011-04-20 NOTE — ED Notes (Signed)
Pt will get moved to psych ED. Report given to [facility]

## 2011-04-20 NOTE — Progress Notes (Signed)
Purse belongings also verified by security , Melida Gimenez

## 2011-04-20 NOTE — Progress Notes (Signed)
Poison control called to get updated on pt. Vitals and pt condition given

## 2011-04-21 LAB — OSMOLALITY: Osmolality: 301 mOsm/kg — ABNORMAL HIGH (ref 275–300)

## 2011-04-21 MED ORDER — LORAZEPAM 1 MG PO TABS
ORAL_TABLET | ORAL | Status: AC
  Administered 2011-04-21: 1 mg via ORAL
  Filled 2011-04-21: qty 1

## 2011-04-21 MED ORDER — ACETAMINOPHEN 325 MG PO TABS
ORAL_TABLET | ORAL | Status: AC
  Administered 2011-04-21: 650 mg via ORAL
  Filled 2011-04-21: qty 2

## 2011-04-21 NOTE — ED Notes (Signed)
Pt d/c. Going to [facility], transported by her boyfriend.

## 2011-04-21 NOTE — BH Assessment (Signed)
Received at call from [facility] regarding patients authorization # and she stated that patient has [facility]. Due to patient having MCD a auth. # is not needed. Contacted [facility] and spoke to [place] regarding this matter. She was given patients MCD # and stated that pt is ok to come to their facility after 2pm today.   Melynda Ripple, MS (Assessment Counselor)

## 2011-04-21 NOTE — ED Provider Notes (Signed)
History     CSN: [id number] Arrival date & time: 04/20/2011  3:18 PM   First MD Initiated Contact with Patient 04/20/11 1604      Chief Complaint  Patient presents with  . Alcohol Intoxication    drank 2 cups of rubbing alcohol trying to get drunk    (Consider location/radiation/quality/duration/timing/severity/associated sxs/prior treatment) HPI Patient is a 35 year old female who presents today by EMS after ingesting 2 cups of rubbing alcohol yesterday at 4 PM. Patient has had numerous episodes of nausea and vomiting. Currently she has mild epigastric abdominal pain. The patient reports that she also has Barrett's esophagus. Patient did not drink this and an attempt to harm herself. She states that she does have a problem with alcohol but does not drink on a daily basis. Patient also endorses marijuana use. She has history of depression, alcohol abuse, and prior suicide attempts. She has had no hematemesis or blood in her stools. There are no other associated or modifying factors. Past Medical History  Diagnosis Date  . Alcohol addiction   . Barrett esophagus   . Acid reflux   . Depression     Past Surgical History  Procedure Date  . Tonsillectomy     History reviewed. No pertinent family history.  History  Substance Use Topics  . Smoking status: Current Everyday Smoker -- 0.5 packs/day  . Smokeless tobacco: Not on file  . Alcohol Use: Yes     last drank on Monday - drinks a pt     OB History    Grav Para Term Preterm Abortions TAB SAB Ect Mult Living                  Review of Systems  Constitutional: Negative.   HENT: Negative.   Eyes: Negative.   Respiratory: Negative.   Cardiovascular: Negative.   Gastrointestinal: Positive for nausea, vomiting and abdominal pain.  Genitourinary: Negative.   Musculoskeletal: Negative.   Neurological: Negative.   Hematological: Negative.   Psychiatric/Behavioral: Negative.   All other systems reviewed and are  negative.    Allergies  Review of patient's allergies indicates no known allergies.  Home Medications   Current Outpatient Rx  Name Route Sig Dispense Refill  . CARBAMAZEPINE 200 MG PO CP12 Oral Take 200 mg by mouth 2 (two) times daily.      . CYCLOBENZAPRINE HCL 10 MG PO TABS Oral Take 5 mg by mouth every 6 (six) hours as needed. Muscle spasm     . ESOMEPRAZOLE MAGNESIUM 40 MG PO CPDR Oral Take 40 mg by mouth daily before breakfast.      . GABAPENTIN 100 MG PO CAPS Oral Take 200 mg by mouth 3 (three) times daily.      Marland Kitchen HYDROXYZINE HCL 25 MG PO TABS Oral Take 25 mg by mouth every 4 (four) hours as needed. anxiety     . PAROXETINE HCL 40 MG PO TABS Oral Take 40 mg by mouth daily.     Marland Kitchen POTASSIUM CHLORIDE 10 MEQ PO TBCR Oral Take 20 mEq by mouth 2 (two) times daily.      . SUCRALFATE 1 G PO TABS Oral Take 1 g by mouth 3 (three) times daily with meals.      . TRAZODONE HCL 50 MG PO TABS Oral Take 50 mg by mouth at bedtime.        BP 109/57  Pulse 77  Temp(Src) 98.7 F (37.1 C) (Oral)  Resp 18  Wt 125 lb (56.7 kg)  SpO2 96%  Physical Exam  Nursing note and vitals reviewed. Constitutional: She is oriented to person, place, and time. She appears well-developed and well-nourished. No distress.       Uncomfortable appearing  HENT:  Head: Normocephalic and atraumatic.  Eyes: Conjunctivae and EOM are normal. Pupils are equal, round, and reactive to light.  Neck: Normal range of motion.  Cardiovascular: Normal rate, regular rhythm, normal heart sounds and intact distal pulses.  Exam reveals no gallop and no friction rub.   No murmur heard. Pulmonary/Chest: Effort normal and breath sounds normal. No respiratory distress. She has no wheezes. She has no rales.  Abdominal: Soft. Bowel sounds are normal. She exhibits no distension. There is tenderness. There is no rebound and no guarding.       Mild epigastric tenderness  Musculoskeletal: Normal range of motion.  Neurological: She is  alert and oriented to person, place, and time. No cranial nerve deficit. She exhibits normal muscle tone. Coordination normal.  Skin: Skin is warm and dry. No rash noted.  Psychiatric:       Endorses desire to have detox. Denies suicidal ideation. Patient reports this was not a suicide attempt. Friend at bedside a confirms that they're not concerned regarding this being a suicide attempt.    ED Course  Procedures (including critical care time)  Date: 04/21/2011  Rate: 76  Rhythm: normal sinus rhythm  QRS Axis: normal  Intervals: normal  ST/T Wave abnormalities: nonspecific T wave changes  Conduction Disutrbances:none  Narrative Interpretation:   Old EKG Reviewed: changes noted  Labs Reviewed  CBC - Abnormal; Notable for the following:    WBC 12.3 (*)    All other components within normal limits  DIFFERENTIAL - Abnormal; Notable for the following:    Neutrophils Relative 84 (*)    Neutro Abs 10.3 (*)    All other components within normal limits  COMPREHENSIVE METABOLIC PANEL - Abnormal; Notable for the following:    Potassium 2.9 (*)    Glucose, Bld 124 (*)    Total Protein 8.4 (*)    All other components within normal limits  SALICYLATE LEVEL - Abnormal; Notable for the following:    Salicylate Lvl <2.0 (*)    All other components within normal limits  URINE RAPID DRUG SCREEN (HOSP PERFORMED) - Abnormal; Notable for the following:    Benzodiazepines POSITIVE (*)    Tetrahydrocannabinol POSITIVE (*)    All other components within normal limits  KETONES, QUALITATIVE - Abnormal; Notable for the following:    Acetone, Bld SMALL (*)    All other components within normal limits  BLOOD GAS, VENOUS - Abnormal; Notable for the following:    pH, Ven 7.458 (*)    pCO2, Ven 37.7 (*)    pO2, Ven 45.3 (*)    Bicarbonate 26.3 (*)    Acid-Base Excess 2.8 (*)    All other components within normal limits  ETHANOL  ACETAMINOPHEN LEVEL  PREGNANCY, URINE  ALCOHOL,  ISOPROPYL  (ISOPROPANOL)  BLOOD GAS, VENOUS  OSMOLALITY   No results found.   1. Isopropyl alcohol poisoning   2. Alcohol addiction       MDM  Patient was evaluated by myself. Given her ingestion labs were ordered.  Patient did tell me that she is a history of hypokalemia. An EKG was performed. There were no concerning findings. Patient did have isopropanol ordered which was negative. Patient did have small amount of ketones in her serum. CBG showed base excess of 2.8.  CMP did show a potassium of 2.9. Tylenol, alcohol, salicylate levels were all normal. Urine drug screen was positive for benzos and THC.  Patient had a mild leukocytosis likely related to recent vomiting.  Patient had a slightly low potassium which was replaced orally. I spoke with poison control regarding monitoring the patient. Given her findings they felt that she was appropriate for admission to psych at this time. Act team was consulted. Patient is currently being reviewed for admission to possible detox facility.Patient was not vomiting here. She was given normal saline IV bolus as well as Zofran.        Cyndra Numbers, MD 04/21/11 [phone]

## 2011-04-21 NOTE — BH Assessment (Signed)
Per shift report, pt has been accepted to [facility] pending a authorization approval by [facility] (aka. [employer]). Writer contacted [employer] 3x's to obtain auth.@ [id number]). Writer was able to only able to leave a voicemail with each attempt. As of 11//29/2012 @ 0907 no call back from [employer]. This Clinical research associate will continue attempting to contact [employer] so that pt's auth. # is obtained for services at [facility].

## 2011-04-21 NOTE — ED Notes (Signed)
Provided patient with toiletries and clean scrubs for shower. Cleaned room and changed linen while pt showering.

## 2011-04-21 NOTE — ED Notes (Signed)
Poison  Control called to follow up. Pt cleared medically.

## 2011-04-21 NOTE — ED Provider Notes (Signed)
Reevaluation: She is alert, cooperative. She has eaten a little better breakfast. This morning. She has no complaints. She is pending transfer to [facility], later today.  Flint Melter, MD 04/21/11 [phone]

## 2011-04-21 NOTE — BH Assessment (Addendum)
Assessment Note   Melody Alexander is an 35 y.o. female.   PRESENTING PROBLEM: Melody Alexander is a 35 year old white female who presents today by EMS after ingesting 2 cups of rubbing alcohol yesterday at 4 PM because she did not have any money to purchase alcohol.  [name] has since been medically cleared for placement.  [name] reports to drinking 2 pints of E&J weekly.  [name] reports that she last used alcohol four days ago.  [name] reports that she was 35 years old when she first tried alcohol and marijuana.  [name] reports that  She was 29 when she began to notice that her drinking was out of control.  [name] denies any withdrawal symptoms at present.  [name] reports that she did not drink the rubbing alcohol in an attempt to harm herself. She states that she does have a problem with alcohol but does not drink on a daily basis. Patient also endorses marijuana use. She has history of depression, alcohol abuse, and prior suicide attempts.  [name] reports prior detox services and mental health hospitalizations in the past at [facility], [facility], [facility] [name] reports a family history of substance abuse and mental illness.  As of 10:58 pm her vitals reflect the following:  BP:   109/57 Pulse:  77 Resp:  18 Temp  98.7 Her Blood Alcohol Level was <11.  Her urinalysis was positive for Benzodiazepines and Tetrahydrocannabinol.      DISPOSITION:  Pending placement at [facility].    Axis I: Alcohol Dependence   Bipolar Disorder  Axis II: Deferred Axis III:  Past Medical History  Diagnosis Date  . Alcohol addiction   . Barrett esophagus   . Acid reflux   . Depression    Axis IV: economic problems, educational problems, occupational problems, other psychosocial or environmental problems, problems related to social environment and problems with primary support group Axis V: 31-40 impairment in reality testing  Past Medical History:  Past Medical History  Diagnosis Date  . Alcohol addiction   . Barrett esophagus     . Acid reflux   . Depression     Past Surgical History  Procedure Date  . Tonsillectomy     Family History: History reviewed. No pertinent family history.  Social History:  reports that she has been smoking.  She does not have any smokeless tobacco history on file. She reports that she drinks alcohol. She reports that she uses illicit drugs (Marijuana).  Allergies: No Known Allergies  Home Medications:  Medications Prior to Admission  Medication Dose Route Frequency Provider Last Rate Last Dose  . acetaminophen (TYLENOL) tablet 650 mg  650 mg Oral Q4H PRN Cyndra Numbers, MD      . alum & mag hydroxide-simeth (MAALOX/MYLANTA) 200-200-20 MG/5ML suspension 30 mL  30 mL Oral PRN Cyndra Numbers, MD      . carbamazepine (CARBATROL) 12 hr capsule 200 mg  200 mg Oral BID Cyndra Numbers, MD   200 mg at 04/20/11 2129  . gabapentin (NEURONTIN) capsule 200 mg  200 mg Oral TID Cyndra Numbers, MD   200 mg at 04/20/11 2129  . LORazepam (ATIVAN) tablet 1 mg  1 mg Oral Q8H PRN Cyndra Numbers, MD      . ondansetron (ZOFRAN) injection 4 mg  4 mg Intravenous Once Cyndra Numbers, MD   4 mg at 04/20/11 1653  . ondansetron (ZOFRAN) injection 4 mg  4 mg Intravenous Once Cyndra Numbers, MD   4 mg at 04/20/11 1851  . ondansetron (ZOFRAN) tablet  4 mg  4 mg Oral Q8H PRN Cyndra Numbers, MD      . pantoprazole (PROTONIX) EC tablet 80 mg  80 mg Oral Q1200 Cyndra Numbers, MD   80 mg at 04/20/11 2028  . pantoprazole (PROTONIX) injection 40 mg  40 mg Intravenous Once Cyndra Numbers, MD   40 mg at 04/20/11 1653  . PARoxetine (PAXIL) tablet 40 mg  40 mg Oral Daily [name] Hunt, MD      . potassium chloride SA (K-DUR,KLOR-CON) CR tablet 20 mEq  20 mEq Oral BID Cyndra Numbers, MD   20 mEq at 04/20/11 2133  . potassium chloride SA (K-DUR,KLOR-CON) CR tablet 40 mEq  40 mEq Oral Once Cyndra Numbers, MD   40 mEq at 04/20/11 1823  . sodium chloride 0.9 % bolus 1,000 mL  1,000 mL Intravenous Once Cyndra Numbers, MD   1,000 mL at 04/20/11 1653  . sucralfate  (CARAFATE) tablet 1 g  1 g Oral TID WC [name] Hunt, MD      . traZODone (DESYREL) tablet 50 mg  50 mg Oral QHS Cyndra Numbers, MD   50 mg at 04/20/11 2219  . zolpidem (AMBIEN) tablet 5 mg  5 mg Oral QHS PRN Cyndra Numbers, MD       No current outpatient prescriptions on file as of 04/20/2011.    OB/GYN Status:  No LMP recorded. Patient has had an implant.  General Assessment Data Living Arrangements: Alone Can pt return to current living arrangement?: Yes Admission Status: Voluntary Is patient capable of signing voluntary admission?: Yes Transfer from: Home Referral Source: Self/Family/Friend  Risk to self Suicidal Ideation: No Suicidal Intent: No Is patient at risk for suicide?: No Suicidal Plan?: No Specify Current Suicidal Plan:  (None ) Access to Means: No What has been your use of drugs/alcohol within the last 12 months?:  (Yes - Alcohol ) Other Self Harm Risks:  (None Reported ) Triggers for Past Attempts: Family contact;Other personal contacts;Unpredictable Intentional Self Injurious Behavior: Bruising Comment - Self Injurious Behavior:  (Scratching head in the past ) Factors that decrease suicide risk: Sense of responsibility to family Family Suicide History: No Recent stressful life event(s): Job Loss;Financial Problems;Turmoil (Comment);Other (Comment) Persecutory voices/beliefs?: No Depression: Yes Depression Symptoms: Insomnia;Tearfulness;Isolating;Fatigue;Loss of interest in usual pleasures;Feeling worthless/self pity Substance abuse history and/or treatment for substance abuse?: Yes Suicide prevention information given to non-admitted patients: Not applicable  Risk to Others Homicidal Ideation: No Thoughts of Harm to Others: No Current Homicidal Intent: No Current Homicidal Plan: No Access to Homicidal Means: No Identified Victim:  (None Reported ) History of harm to others?: No Assessment of Violence: None Noted Violent Behavior Description:  (None Reported  ) Does patient have access to weapons?: No Does patient have a court date: No  Mental Status Report Appear/Hygiene: Disheveled;Layered clothes Eye Contact: Poor Motor Activity: Freedom of movement Speech: Logical/coherent Level of Consciousness: Alert;Quiet/awake Mood: Depressed;Anxious;Ashamed/humiliated;Helpless Affect: Anxious;Depressed Anxiety Level: Minimal Thought Processes: Coherent;Relevant Judgement: Unimpaired Orientation: Person;Place;Time;Situation Obsessive Compulsive Thoughts/Behaviors:  (None Reported )  Cognitive Functioning Concentration: Decreased Memory: Recent Intact;Remote Impaired IQ: Average Insight: Fair Impulse Control: Poor Appetite: Poor Weight Loss:  (None Reported) Weight Gain:  (None ) Sleep: Decreased Total Hours of Sleep:  (5) Vegetative Symptoms: None  Prior Inpatient/Outpatient Therapy Prior Therapy Dates:  (2012) Prior Therapy Facilty/Provider(s):  (She could not remember the name of the Facility ) Reason for Treatment:  (Detox )            Values / Beliefs Cultural  Requests During Hospitalization: None Spiritual Requests During Hospitalization: None        Additional Information 1:1 In Past 12 Months?: No CIRT Risk: No Elopement Risk: No Does patient have medical clearance?: Yes     Disposition:     On Site Evaluation by:   Reviewed with Physician:     Phillip Heal LaVerne 04/21/2011 2:02 AM

## 2011-11-04 ENCOUNTER — Encounter (HOSPITAL_COMMUNITY): Payer: Self-pay | Admitting: Emergency Medicine

## 2011-11-04 ENCOUNTER — Emergency Department (HOSPITAL_COMMUNITY)
Admission: EM | Admit: 2011-11-04 | Discharge: 2011-11-04 | Disposition: A | Discharge status: Home / Self Care | Payer: Medicaid Other | Attending: Emergency Medicine | Admitting: Emergency Medicine

## 2011-11-04 DIAGNOSIS — S61209A Unspecified open wound of unspecified finger without damage to nail, initial encounter: Secondary | ICD-10-CM | POA: Insufficient documentation

## 2011-11-04 DIAGNOSIS — W260XXA Contact with knife, initial encounter: Secondary | ICD-10-CM | POA: Insufficient documentation

## 2011-11-04 DIAGNOSIS — S61219A Laceration without foreign body of unspecified finger without damage to nail, initial encounter: Secondary | ICD-10-CM

## 2011-11-04 DIAGNOSIS — F17200 Nicotine dependence, unspecified, uncomplicated: Secondary | ICD-10-CM | POA: Insufficient documentation

## 2011-11-04 DIAGNOSIS — W261XXA Contact with sword or dagger, initial encounter: Secondary | ICD-10-CM | POA: Insufficient documentation

## 2011-11-04 NOTE — Discharge Instructions (Signed)
Keep finger clean. Ibuprofen for pain. Keep dressing on, topical triple antibiotic ointment twice a day. Watch for signs of infection. Follow up in 7 days for suture removal.   Laceration Care, Adult A laceration is a cut or lesion that goes through all layers of the skin and into the tissue just beneath the skin. TREATMENT  Some lacerations may not require closure. Some lacerations may not be able to be closed due to an increased risk of infection. It is important to see your caregiver as soon as possible after an injury to minimize the risk of infection and maximize the opportunity for successful closure. If closure is appropriate, pain medicines may be given, if needed. The wound will be cleaned to help prevent infection. Your caregiver will use stitches (sutures), staples, wound glue (adhesive), or skin adhesive strips to repair the laceration. These tools bring the skin edges together to allow for faster healing and a better cosmetic outcome. However, all wounds will heal with a scar. Once the wound has healed, scarring can be minimized by covering the wound with sunscreen during the day for 1 full year. HOME CARE INSTRUCTIONS  For sutures or staples:  Keep the wound clean and dry.   If you were given a bandage (dressing), you should change it at least once a day. Also, change the dressing if it becomes wet or dirty, or as directed by your caregiver.   Wash the wound with soap and water 2 times a day. Rinse the wound off with water to remove all soap. Pat the wound dry with a clean towel.   After cleaning, apply a thin layer of the antibiotic ointment as recommended by your caregiver. This will help prevent infection and keep the dressing from sticking.   You may shower as usual after the first 24 hours. Do not soak the wound in water until the sutures are removed.   Only take over-the-counter or prescription medicines for pain, discomfort, or fever as directed by your caregiver.   Get  your sutures or staples removed as directed by your caregiver.  For skin adhesive strips:  Keep the wound clean and dry.   Do not get the skin adhesive strips wet. You may bathe carefully, using caution to keep the wound dry.   If the wound gets wet, pat it dry with a clean towel.   Skin adhesive strips will fall off on their own. You may trim the strips as the wound heals. Do not remove skin adhesive strips that are still stuck to the wound. They will fall off in time.  For wound adhesive:  You may briefly wet your wound in the shower or bath. Do not soak or scrub the wound. Do not swim. Avoid periods of heavy perspiration until the skin adhesive has fallen off on its own. After showering or bathing, gently pat the wound dry with a clean towel.   Do not apply liquid medicine, cream medicine, or ointment medicine to your wound while the skin adhesive is in place. This may loosen the film before your wound is healed.   If a dressing is placed over the wound, be careful not to apply tape directly over the skin adhesive. This may cause the adhesive to be pulled off before the wound is healed.   Avoid prolonged exposure to sunlight or tanning lamps while the skin adhesive is in place. Exposure to ultraviolet light in the first year will darken the scar.   The skin adhesive will usually  remain in place for 5 to 10 days, then naturally fall off the skin. Do not pick at the adhesive film.  You may need a tetanus shot if:  You cannot remember when you had your last tetanus shot.   You have never had a tetanus shot.  If you get a tetanus shot, your arm may swell, get red, and feel warm to the touch. This is common and not a problem. If you need a tetanus shot and you choose not to have one, there is a rare chance of getting tetanus. Sickness from tetanus can be serious. SEEK MEDICAL CARE IF:   You have redness, swelling, or increasing pain in the wound.   You see a red line that goes away from  the wound.   You have yellowish-white fluid (pus) coming from the wound.   You have a fever.   You notice a bad smell coming from the wound or dressing.   Your wound breaks open before or after sutures have been removed.   You notice something coming out of the wound such as wood or glass.   Your wound is on your hand or foot and you cannot move a finger or toe.  SEEK IMMEDIATE MEDICAL CARE IF:   Your pain is not controlled with prescribed medicine.   You have severe swelling around the wound causing pain and numbness or a change in color in your arm, hand, leg, or foot.   Your wound splits open and starts bleeding.   You have worsening numbness, weakness, or loss of function of any joint around or beyond the wound.   You develop painful lumps near the wound or on the skin anywhere on your body.  MAKE SURE YOU:   Understand these instructions.   Will watch your condition.   Will get help right away if you are not doing well or get worse.  Document Released: 05/09/2005 Document Revised: 04/28/2011 Document Reviewed: 11/02/2010 [facility] Patient Information 2012 [place], [place].

## 2011-11-04 NOTE — ED Notes (Signed)
PT. REPORTS LEFT INDEX FINGER LACERATION THIS EVENING ACCIDENTALLY HIT WITH A KNIFE WHILE COOKING , DRESSING APPLIED AT TRIAGE.  MODERATE BLEEDING , TETANUS IMMUNIZATION LESS THAN 5 YEARS AGO.

## 2011-11-04 NOTE — ED Provider Notes (Signed)
History     CSN: [id number]  Arrival date & time 11/04/11  2029   First MD Initiated Contact with Patient 11/04/11 2133      Chief Complaint  Patient presents with  . Laceration    (Consider location/radiation/quality/duration/timing/severity/associated sxs/prior treatment) Patient is a 36 y.o. female presenting with skin laceration. The history is provided by the patient.  Laceration  The incident occurred 1 to 2 hours ago. The laceration is located on the left hand. The laceration is 3 cm in size. The laceration mechanism was a a dirty knife.  Pt was slicing cucumber and cut her left index finger with a knife. States unable to control the bleeding. Denies numbenss or weakness. Able to move finger in all directions. Not on blood thinners. Pressure applied at home and again at triage. Tetanus up to date.   Past Medical History  Diagnosis Date  . Alcohol addiction   . Barrett esophagus   . Acid reflux   . Depression     Past Surgical History  Procedure Date  . Tonsillectomy     No family history on file.  History  Substance Use Topics  . Smoking status: Current Everyday Smoker -- 0.5 packs/day  . Smokeless tobacco: Not on file  . Alcohol Use: Yes     last drank on Monday - drinks a pt     OB History    Grav Para Term Preterm Abortions TAB SAB Ect Mult Living                  Review of Systems  Constitutional: Negative for fever and chills.  Respiratory: Negative.   Cardiovascular: Negative.   Skin: Positive for wound.  Neurological: Negative for weakness and numbness.    Allergies  Review of patient's allergies indicates no known allergies.  Home Medications   Current Outpatient Rx  Name Route Sig Dispense Refill  . CARBAMAZEPINE ER 200 MG PO CP12 Oral Take 200 mg by mouth 2 (two) times daily.      Marland Kitchen ESOMEPRAZOLE MAGNESIUM 40 MG PO CPDR Oral Take 40 mg by mouth daily before breakfast.      . GABAPENTIN 100 MG PO CAPS Oral Take 200 mg by mouth 3 (three)  times daily.      Marland Kitchen HYDROXYZINE HCL 25 MG PO TABS Oral Take 25 mg by mouth every 4 (four) hours as needed. anxiety     . MAGNESIUM 250 MG PO TABS Oral Take 1 tablet by mouth daily.    Marland Kitchen PAROXETINE HCL 40 MG PO TABS Oral Take 40 mg by mouth daily.     Marland Kitchen POTASSIUM CHLORIDE 10 MEQ PO TBCR Oral Take 20 mEq by mouth 2 (two) times daily.      . SUCRALFATE 1 G PO TABS Oral Take 1 g by mouth 3 (three) times daily with meals.      . TRAZODONE HCL 50 MG PO TABS Oral Take 50 mg by mouth at bedtime as needed. For insomnia    . VITAMIN B-12 500 MCG PO TABS Oral Take 500 mcg by mouth daily.      BP 114/71  Pulse 83  Temp 98.4 F (36.9 C) (Oral)  Resp 18  SpO2 99%  Physical Exam  Nursing note and vitals reviewed. Constitutional: She is oriented to person, place, and time. She appears well-developed and well-nourished. No distress.  HENT:  Head: Normocephalic.  Cardiovascular: Normal rate, regular rhythm and normal heart sounds.   Pulmonary/Chest: Effort normal and breath  sounds normal. No respiratory distress. She has no wheezes. She has no rales.  Musculoskeletal:       Full rom of left index finger, normal strength against resistance with flexion and extension at all joints. Cap refill of distal finger <2sec. Normal sensation. Laceration to the dorsal lateral aspect of the finger, about 3cm in length, over proximal phalanx  Neurological: She is alert and oriented to person, place, and time.  Skin: Skin is warm and dry.       See musculoskeletal exam  Psychiatric: She has a normal mood and affect.    ED Course  Procedures (including critical care time)  LACERATION REPAIR Performed by: Lottie Mussel Authorized by: Jaynie Crumble A Consent: Verbal consent obtained. Risks and benefits: risks, benefits and alternatives were discussed Consent given by: patient Patient identity confirmed: provided demographic data Prepped and Draped in normal sterile fashion Wound  explored  Laceration Location: left index finger  Laceration Length: 3cm  No Foreign Bodies seen or palpated  Anesthesia: local infiltration  Local anesthetic: lidocaine 2% wo epinephrine  Anesthetic total: 3 ml  Irrigation method: syringe Amount of cleaning: standard  Skin closure: prolene 5.0   Number of sutures: 7  Technique: simple interrupted  Patient tolerance: Patient tolerated the procedure well with no immediate complications.   10:02 PM Laceration repaired. No evidence of tendon or ligamentous injury. Full rom, good strength, good cap refille. Will d/c home with follow up.   1. Laceration of finger of left hand       MDM         Lottie Mussel, PA 11/05/11 [phone]

## 2011-11-05 NOTE — ED Provider Notes (Signed)
Medical screening examination/treatment/procedure(s) were performed by non-physician practitioner and as supervising physician I was immediately available for consultation/collaboration.   Gerhard Munch, MD 11/05/11 2355

## 2011-11-08 ENCOUNTER — Encounter (HOSPITAL_COMMUNITY): Payer: Self-pay | Admitting: Emergency Medicine

## 2011-11-08 DIAGNOSIS — R45851 Suicidal ideations: Secondary | ICD-10-CM | POA: Insufficient documentation

## 2011-11-08 DIAGNOSIS — R5381 Other malaise: Secondary | ICD-10-CM | POA: Insufficient documentation

## 2011-11-08 DIAGNOSIS — K219 Gastro-esophageal reflux disease without esophagitis: Secondary | ICD-10-CM | POA: Insufficient documentation

## 2011-11-08 DIAGNOSIS — F329 Major depressive disorder, single episode, unspecified: Secondary | ICD-10-CM | POA: Insufficient documentation

## 2011-11-08 DIAGNOSIS — K2270 Barrett's esophagus without dysplasia: Secondary | ICD-10-CM | POA: Insufficient documentation

## 2011-11-08 DIAGNOSIS — F1010 Alcohol abuse, uncomplicated: Secondary | ICD-10-CM | POA: Insufficient documentation

## 2011-11-08 DIAGNOSIS — F3289 Other specified depressive episodes: Secondary | ICD-10-CM | POA: Insufficient documentation

## 2011-11-08 DIAGNOSIS — F17200 Nicotine dependence, unspecified, uncomplicated: Secondary | ICD-10-CM | POA: Insufficient documentation

## 2011-11-08 DIAGNOSIS — R110 Nausea: Secondary | ICD-10-CM | POA: Insufficient documentation

## 2011-11-08 NOTE — ED Notes (Signed)
Patient here for detox of alcohol.  Last drink was around 4pm today.  Patient is tearful, wanting help.

## 2011-11-09 ENCOUNTER — Emergency Department (HOSPITAL_COMMUNITY)
Admission: EM | Admit: 2011-11-09 | Discharge: 2011-11-10 | Disposition: A | Discharge status: Other Acute Inpatient Hospital | Payer: 59 | Source: Home / Self Care | Attending: Emergency Medicine | Admitting: Emergency Medicine

## 2011-11-09 DIAGNOSIS — F329 Major depressive disorder, single episode, unspecified: Secondary | ICD-10-CM

## 2011-11-09 DIAGNOSIS — F32A Depression, unspecified: Secondary | ICD-10-CM

## 2011-11-09 DIAGNOSIS — F1020 Alcohol dependence, uncomplicated: Secondary | ICD-10-CM

## 2011-11-09 DIAGNOSIS — E876 Hypokalemia: Secondary | ICD-10-CM

## 2011-11-09 LAB — DIFFERENTIAL
Basophils Relative: 1 % (ref 0–1)
Lymphocytes Relative: 53 % — ABNORMAL HIGH (ref 12–46)
Lymphs Abs: 2.6 10*3/uL (ref 0.7–4.0)
Monocytes Relative: 9 % (ref 3–12)
Neutro Abs: 1.6 10*3/uL — ABNORMAL LOW (ref 1.7–7.7)
Neutrophils Relative %: 33 % — ABNORMAL LOW (ref 43–77)

## 2011-11-09 LAB — CBC
HCT: 34.1 % — ABNORMAL LOW (ref 36.0–46.0)
Hemoglobin: 11.2 g/dL — ABNORMAL LOW (ref 12.0–15.0)
RBC: 4.26 MIL/uL (ref 3.87–5.11)

## 2011-11-09 LAB — RAPID URINE DRUG SCREEN, HOSP PERFORMED
Barbiturates: NOT DETECTED
Tetrahydrocannabinol: POSITIVE — AB

## 2011-11-09 LAB — BASIC METABOLIC PANEL
BUN: 4 mg/dL — ABNORMAL LOW (ref 6–23)
CO2: 27 mEq/L (ref 19–32)
Chloride: 101 mEq/L (ref 96–112)
GFR calc Af Amer: >90 mL/min (ref >90)
Glucose, Bld: 84 mg/dL (ref 70–99)
Potassium: 2.6 mEq/L — CL (ref 3.5–5.1)

## 2011-11-09 MED ORDER — FOLIC ACID 1 MG PO TABS
1.0000 mg | ORAL_TABLET | Freq: Once | ORAL | Status: AC
  Filled 2011-11-09: qty 1

## 2011-11-09 MED ORDER — IBUPROFEN 200 MG PO TABS: 600.0000 mg | ORAL_TABLET | Freq: Three times a day (TID) | ORAL | Status: DC | PRN

## 2011-11-09 MED ORDER — GABAPENTIN 100 MG PO CAPS
200.0000 mg | ORAL_CAPSULE | Freq: Three times a day (TID) | ORAL | Status: DC
  Administered 2011-11-09 – 2011-11-10 (×2): 200 mg via ORAL
  Filled 2011-11-09 (×4): qty 2

## 2011-11-09 MED ORDER — PANTOPRAZOLE SODIUM 40 MG PO TBEC
40.0000 mg | DELAYED_RELEASE_TABLET | Freq: Every day | ORAL | Status: DC
  Administered 2011-11-09 – 2011-11-10 (×2): 40 mg via ORAL
  Filled 2011-11-09 (×2): qty 1

## 2011-11-09 MED ORDER — LORAZEPAM 1 MG PO TABS: 0.0000 mg | ORAL_TABLET | Freq: Two times a day (BID) | ORAL | Status: DC

## 2011-11-09 MED ORDER — SUCRALFATE 1 G PO TABS
1.0000 g | ORAL_TABLET | Freq: Three times a day (TID) | ORAL | Status: DC
  Administered 2011-11-10 (×2): 1 g via ORAL
  Filled 2011-11-09 (×5): qty 1

## 2011-11-09 MED ORDER — PAROXETINE HCL 20 MG PO TABS
40.0000 mg | ORAL_TABLET | Freq: Every day | ORAL | Status: DC
  Administered 2011-11-09 – 2011-11-10 (×2): 40 mg via ORAL
  Filled 2011-11-09 (×3): qty 2

## 2011-11-09 MED ORDER — MAGNESIUM SULFATE 40 MG/ML IJ SOLN
2.0000 g | Freq: Once | INTRAMUSCULAR | Status: AC
  Administered 2011-11-09: 2 g via INTRAVENOUS
  Filled 2011-11-09: qty 50

## 2011-11-09 MED ORDER — VITAMIN B-1 100 MG PO TABS
100.0000 mg | ORAL_TABLET | Freq: Once | ORAL | Status: AC
  Filled 2011-11-09: qty 1

## 2011-11-09 MED ORDER — ONDANSETRON 4 MG PO TBDP
4.0000 mg | ORAL_TABLET | Freq: Once | ORAL | Status: AC
  Administered 2011-11-09: 4 mg via ORAL

## 2011-11-09 MED ORDER — ONDANSETRON HCL 8 MG PO TABS
4.0000 mg | ORAL_TABLET | Freq: Three times a day (TID) | ORAL | Status: DC | PRN
  Administered 2011-11-09: 8 mg via ORAL
  Filled 2011-11-09 (×2): qty 1

## 2011-11-09 MED ORDER — VITAMIN B-1 100 MG PO TABS
100.0000 mg | ORAL_TABLET | Freq: Every day | ORAL | Status: DC
  Administered 2011-11-09 – 2011-11-10 (×2): 100 mg via ORAL
  Filled 2011-11-09: qty 1

## 2011-11-09 MED ORDER — SODIUM CHLORIDE 0.9 % IV SOLN
Freq: Once | INTRAVENOUS | Status: AC
  Administered 2011-11-09: 04:00:00 via INTRAVENOUS

## 2011-11-09 MED ORDER — TRAZODONE HCL 50 MG PO TABS
50.0000 mg | ORAL_TABLET | Freq: Every evening | ORAL | Status: DC | PRN
  Administered 2011-11-09: 50 mg via ORAL
  Filled 2011-11-09: qty 1

## 2011-11-09 MED ORDER — LORAZEPAM 1 MG PO TABS
0.0000 mg | ORAL_TABLET | Freq: Four times a day (QID) | ORAL | Status: DC
  Administered 2011-11-09 (×2): 2 mg via ORAL
  Administered 2011-11-09 – 2011-11-10 (×4): 1 mg via ORAL
  Filled 2011-11-09 (×3): qty 1

## 2011-11-09 MED ORDER — ADULT MULTIVITAMIN W/MINERALS CH
1.0000 | ORAL_TABLET | Freq: Once | ORAL | Status: AC
  Filled 2011-11-09: qty 1

## 2011-11-09 MED ORDER — THIAMINE HCL 100 MG/ML IJ SOLN: 100.0000 mg | Freq: Every day | INTRAMUSCULAR | Status: DC

## 2011-11-09 MED ORDER — CARBAMAZEPINE ER 200 MG PO TB12
200.0000 mg | ORAL_TABLET | Freq: Two times a day (BID) | ORAL | Status: DC
  Administered 2011-11-09 – 2011-11-10 (×2): 200 mg via ORAL
  Filled 2011-11-09 (×3): qty 1

## 2011-11-09 MED ORDER — FOLIC ACID 1 MG PO TABS
1.0000 mg | ORAL_TABLET | Freq: Every day | ORAL | Status: DC
  Administered 2011-11-09 – 2011-11-10 (×2): 1 mg via ORAL
  Filled 2011-11-09: qty 1

## 2011-11-09 MED ORDER — LORAZEPAM 2 MG/ML IJ SOLN
2.0000 mg | Freq: Once | INTRAMUSCULAR | Status: AC
  Administered 2011-11-09: 2 mg via INTRAVENOUS
  Filled 2011-11-09: qty 1

## 2011-11-09 MED ORDER — POTASSIUM CHLORIDE CRYS ER 20 MEQ PO TBCR
20.0000 meq | EXTENDED_RELEASE_TABLET | Freq: Two times a day (BID) | ORAL | Status: DC
  Administered 2011-11-09 – 2011-11-10 (×3): 20 meq via ORAL
  Filled 2011-11-09 (×2): qty 1

## 2011-11-09 MED ORDER — ONDANSETRON 4 MG PO TBDP
ORAL_TABLET | ORAL | Status: AC
  Filled 2011-11-09: qty 1

## 2011-11-09 MED ORDER — ACETAMINOPHEN 325 MG PO TABS
650.0000 mg | ORAL_TABLET | ORAL | Status: DC | PRN
  Administered 2011-11-09: 650 mg via ORAL
  Filled 2011-11-09: qty 2

## 2011-11-09 MED ORDER — LORAZEPAM 1 MG PO TABS
1.0000 mg | ORAL_TABLET | ORAL | Status: DC | PRN
  Filled 2011-11-09 (×2): qty 2
  Filled 2011-11-09: qty 1

## 2011-11-09 MED ORDER — POTASSIUM CHLORIDE 10 MEQ/100ML IV SOLN
10.0000 meq | INTRAVENOUS | Status: AC
  Administered 2011-11-09 (×2): 10 meq via INTRAVENOUS
  Filled 2011-11-09: qty 200

## 2011-11-09 MED ORDER — LORAZEPAM 2 MG/ML IJ SOLN: 1.0000 mg | Freq: Four times a day (QID) | INTRAMUSCULAR | Status: DC | PRN

## 2011-11-09 MED ORDER — LORAZEPAM 1 MG PO TABS
1.0000 mg | ORAL_TABLET | Freq: Four times a day (QID) | ORAL | Status: DC | PRN
  Filled 2011-11-09: qty 1

## 2011-11-09 MED ORDER — ZOLPIDEM TARTRATE 5 MG PO TABS: 5.0000 mg | ORAL_TABLET | Freq: Every evening | ORAL | Status: DC | PRN

## 2011-11-09 MED ORDER — ADULT MULTIVITAMIN W/MINERALS CH
1.0000 | ORAL_TABLET | Freq: Every day | ORAL | Status: DC
  Administered 2011-11-09 – 2011-11-10 (×2): 1 via ORAL
  Filled 2011-11-09: qty 1

## 2011-11-09 NOTE — ED Notes (Signed)
"  I am a recovering alcoholic, I have been to rehab several times. My last drink was about 4:00 yesterday. I am already starting to feel the withdrawals": states last time she was in rehab was approx 1 year ago; presently CAOx4; approriate in conversation; when asked about medical complaints, she replies "I just feel sick"; states drinks liquor - approx 1 pint per day; denies any other recreational drug use/abuse; reports recently diagnosed with Barrett's Esophagus

## 2011-11-09 NOTE — Progress Notes (Signed)
Patient will need to be re-run in the AM once K+ is stabilized and remains = or > 3.4 for at least 8 hours/ Dr. Dan Humphreys.

## 2011-11-09 NOTE — ED Notes (Signed)
Pt to room from pod B. Resting at this time. Warm blankets given. Denies further needs at this time

## 2011-11-09 NOTE — BH Assessment (Signed)
Assessment Note   Melody Alexander is an 36 y.o. female that presented to [facility] ED to address her relapsing on alcohol.  Pt reports drinking 1 pint of Brandy for two weeks after almost a year of sobriety.  Pt is also positive for Cannibus Abuse and reports occassional use, last use several days ago.  Pt further admits a history of depression and anxiety-related symptoms.  Pt voices taking Prozac and Xanax for "mental health issues" for years, as well.  Pt had a previous admission to [facility] in 2008 for a previous suicide attempt and substance abuse.  Pt denies active suicidal ideation but does voice increasing hopelessness and helplessness.  Pt currently states that she is nauseous, has sweats, headache, and shakiness.  CIWA is currently a 11.  Patient would like to be considered for inpatient treatment for detox and mental health symptoms.      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 Axis III:  Past Medical History  Diagnosis Date  . Alcohol addiction   . Barrett esophagus   . Acid reflux   . Depression    Axis IV: economic problems, housing problems, occupational problems, other psychosocial or environmental problems, problems related to social environment and problems with access to health care services Axis V: 31-40 impairment in reality testing  Past Medical History:  Past Medical History  Diagnosis Date  . Alcohol addiction   . Barrett esophagus   . Acid reflux   . Depression     Past Surgical History  Procedure Date  . Tonsillectomy     Family History: History reviewed. No pertinent family history.  Social History:  reports that she has been smoking.  She does not have any smokeless tobacco history on file. She reports that she drinks alcohol. She reports that she uses illicit drugs (Marijuana).  Additional Social History:  Alcohol / Drug Use Pain Medications: Yes Prescriptions: Yes Over the Counter: No History of alcohol /  drug use?: Yes Substance #1 Name of Substance 1: ETOH 1 - Age of First Use: 18 1 - Amount (size/oz): 1 pint + for past two weeks 1 - Frequency: QD 1 - Duration: for past two weeks/sober for one year prior 1 - Last Use / Amount: 11/08/2011 Substance #2 Name of Substance 2: Cannibus 2 - Age of First Use: 18 2 - Amount (size/oz): occassional use 2 - Frequency: weeks 2 - Duration: years 2 - Last Use / Amount: 10/22/2011  CIWA: CIWA-Ar BP: 105/62 mmHg Pulse Rate: 95  Nausea and Vomiting: 2 Tactile Disturbances: none Tremor: two Auditory Disturbances: not present Paroxysmal Sweats: two Visual Disturbances: very mild sensitivity Anxiety: two Headache, Fullness in Head: mild Agitation: normal activity Orientation and Clouding of Sensorium: oriented and can do serial additions CIWA-Ar Total: 11  COWS:    Allergies: No Known Allergies  Home Medications:  (Not in a hospital admission)  OB/GYN Status:  No LMP recorded. Patient has had an implant.  General Assessment Data Location of Assessment: [facility] ED Living Arrangements: Children Can pt return to current living arrangement?: Yes Admission Status: Voluntary Is patient capable of signing voluntary admission?: Yes Transfer from: Acute Hospital Referral Source: Self/Family/Friend  Education Status Is patient currently in school?: No  Risk to self Suicidal Ideation: Yes-Currently Present Suicidal Intent: No-Not Currently/Within Last 6 Months Is patient at risk for suicide?: Yes Suicidal Plan?: No-Not Currently/Within Last 6 Months Access to Means: No What has been your use of drugs/alcohol within  the last 12 months?: 2 weeks of daily Brandy use and Cannibus use occassionally Previous Attempts/Gestures: Yes How many times?: 1  Other Self Harm Risks: impulsive, reactive Triggers for Past Attempts: Unpredictable Intentional Self Injurious Behavior: None Family Suicide History: No Recent stressful life event(s): Recent  negative physical changes Persecutory voices/beliefs?: No Depression: Yes Depression Symptoms: Insomnia;Fatigue;Guilt;Loss of interest in usual pleasures;Feeling worthless/self pity Substance abuse history and/or treatment for substance abuse?: Yes Suicide prevention information given to non-admitted patients: Not applicable  Risk to Others Homicidal Ideation: No Thoughts of Harm to Others: No Current Homicidal Intent: No Current Homicidal Plan: No Access to Homicidal Means: No Identified Victim: N/A History of harm to others?: No Assessment of Violence: None Noted Violent Behavior Description: N/A Does patient have access to weapons?: No Criminal Charges Pending?: No Does patient have a court date: No  Psychosis Hallucinations: None noted Delusions: None noted  Mental Status Report Appear/Hygiene: Disheveled Eye Contact: Good Motor Activity: Tremors;Unremarkable Speech: Logical/coherent Level of Consciousness: Quiet/awake Mood: Depressed;Labile;Anxious Affect: Anxious;Depressed Anxiety Level: Moderate Thought Processes: Relevant Judgement: Impaired Orientation: Person;Place;Time;Situation Obsessive Compulsive Thoughts/Behaviors: Severe  Cognitive Functioning Concentration: Normal Memory: Recent Intact;Remote Intact IQ: Average Insight: Good Impulse Control: Poor Appetite: Fair Weight Loss: 20  Weight Gain: 0  Sleep: Decreased Total Hours of Sleep: 2  Vegetative Symptoms: None  ADLScreening [facility] Assessment Services) Patient's cognitive ability adequate to safely complete daily activities?: Yes Patient able to express need for assistance with ADLs?: Yes Independently performs ADLs?: Yes  Abuse/Neglect [facility]) Physical Abuse: Denies Verbal Abuse: Denies Sexual Abuse: Denies  Prior Inpatient Therapy Prior Inpatient Therapy: Yes Prior Therapy Dates: 2012; 2008 Prior Therapy Facilty/Provider(s): [facility]; [place] Reason for Treatment: substance abuse;  depression  Prior Outpatient Therapy Prior Outpatient Therapy: No Prior Therapy Dates: n/a Prior Therapy Facilty/Provider(s): n/a Reason for Treatment: n/a  ADL Screening (condition at time of admission) Patient's cognitive ability adequate to safely complete daily activities?: Yes Patient able to express need for assistance with ADLs?: Yes Independently performs ADLs?: Yes       Abuse/Neglect Assessment (Assessment to be complete while patient is alone) Physical Abuse: Denies Verbal Abuse: Denies Sexual Abuse: Denies Exploitation of patient/patient's resources: Denies Self-Neglect: Denies Values / Beliefs Cultural Requests During Hospitalization: None Spiritual Requests During Hospitalization: None   Advance Directives (For Healthcare) Advance Directive: Patient does not have advance directive    Additional Information 1:1 In Past 12 Months?: No CIRT Risk: No Elopement Risk: No Does patient have medical clearance?: Yes     Disposition: Referred to [facility] to inpatient. Disposition Disposition of Patient: Inpatient treatment program Type of inpatient treatment program: Adult  On Site Evaluation by:   Reviewed with Physician:     Angelica Ran 11/09/2011 11:39 AM

## 2011-11-09 NOTE — ED Notes (Signed)
Washcloths and towel given to patient to wash up in room per request. Pt also requested and was given feminine hygiene pads

## 2011-11-09 NOTE — ED Provider Notes (Signed)
History     CSN: [id number]  Arrival date & time 11/08/11  2234   First MD Initiated Contact with Patient 11/09/11 0235      Chief Complaint  Patient presents with  . Alcohol Problem    (Consider location/radiation/quality/duration/timing/severity/associated sxs/prior treatment) HPI Comments: Patient with long history of alcohol abuse presents tonight requesting detox - she states that she had stopped drinking for about 1 year but several months ago she started back - she reports this is worse over the past 2 weeks with daily drinking - she states she has a history of shaking with withdrawals but denies hallucinations.  She states that she has been thinking about suicide though she has no specific plan - she denies homicidal ideation.  She reports last drink was at 1600 yesterday.  Patient is a 36 y.o. female presenting with alcohol problem. The history is provided by the patient. No language interpreter was used.  Alcohol Problem This is a recurrent problem. The current episode started 1 to 4 weeks ago. The problem occurs constantly. The problem has been unchanged. Associated symptoms include fatigue and nausea. Pertinent negatives include no abdominal pain, anorexia, arthralgias, change in bowel habit, chest pain, chills, congestion, coughing, diaphoresis, fever, headaches, joint swelling, myalgias, neck pain, numbness, rash, sore throat, swollen glands, urinary symptoms, vertigo, visual change, vomiting or weakness. Nothing aggravates the symptoms. She has tried nothing for the symptoms. The treatment provided no relief.    Past Medical History  Diagnosis Date  . Alcohol addiction   . Barrett esophagus   . Acid reflux   . Depression     Past Surgical History  Procedure Date  . Tonsillectomy     History reviewed. No pertinent family history.  History  Substance Use Topics  . Smoking status: Current Everyday Smoker -- 0.5 packs/day  . Smokeless tobacco: Not on file  .  Alcohol Use: Yes     last drank on Monday - drinks a pt     OB History    Grav Para Term Preterm Abortions TAB SAB Ect Mult Living                  Review of Systems  Constitutional: Positive for fatigue. Negative for fever, chills and diaphoresis.  HENT: Negative for congestion, sore throat and neck pain.   Eyes: Negative for pain.  Respiratory: Negative for cough.   Cardiovascular: Negative for chest pain.  Gastrointestinal: Positive for nausea. Negative for vomiting, abdominal pain, anorexia and change in bowel habit.  Genitourinary: Negative for dysuria.  Musculoskeletal: Negative for myalgias, joint swelling and arthralgias.  Skin: Negative for rash.  Neurological: Negative for vertigo, weakness, numbness and headaches.  All other systems reviewed and are negative.    Allergies  Review of patient's allergies indicates no known allergies.  Home Medications   Current Outpatient Rx  Name Route Sig Dispense Refill  . CARBAMAZEPINE ER 200 MG PO CP12 Oral Take 200 mg by mouth 2 (two) times daily.      Marland Kitchen ESOMEPRAZOLE MAGNESIUM 40 MG PO CPDR Oral Take 40 mg by mouth daily before breakfast.      . GABAPENTIN 100 MG PO CAPS Oral Take 200 mg by mouth 3 (three) times daily.      Marland Kitchen HYDROXYZINE HCL 25 MG PO TABS Oral Take 25 mg by mouth every 4 (four) hours as needed. anxiety     . MAGNESIUM 250 MG PO TABS Oral Take 1 tablet by mouth daily.    Marland Kitchen  PAROXETINE HCL 40 MG PO TABS Oral Take 40 mg by mouth daily.     Marland Kitchen POTASSIUM CHLORIDE 10 MEQ PO TBCR Oral Take 20 mEq by mouth 2 (two) times daily.      . SUCRALFATE 1 G PO TABS Oral Take 1 g by mouth 3 (three) times daily with meals.      . TRAZODONE HCL 50 MG PO TABS Oral Take 50 mg by mouth at bedtime as needed. For insomnia    . VITAMIN B-12 500 MCG PO TABS Oral Take 500 mcg by mouth daily.      BP 141/85  Pulse 95  Temp 98.8 F (37.1 C) (Oral)  Resp 18  SpO2 99%  Physical Exam  Nursing note and vitals reviewed. Constitutional:  She is oriented to person, place, and time. She appears well-developed and well-nourished. No distress.  HENT:  Head: Normocephalic and atraumatic.  Right Ear: External ear normal.  Left Ear: External ear normal.  Nose: Nose normal.  Mouth/Throat: Oropharynx is clear and moist. No oropharyngeal exudate.  Eyes: Conjunctivae are normal. Pupils are equal, round, and reactive to light. No scleral icterus.  Neck: Normal range of motion. Neck supple.  Cardiovascular: Normal rate, regular rhythm and normal heart sounds.  Exam reveals no gallop and no friction rub.   No murmur heard. Pulmonary/Chest: Effort normal and breath sounds normal. No respiratory distress. She has no wheezes. She has no rales. She exhibits no tenderness.  Abdominal: Soft. Bowel sounds are normal. She exhibits no distension. There is no tenderness. There is no rebound and no guarding.  Musculoskeletal: Normal range of motion. She exhibits no edema and no tenderness.  Lymphadenopathy:    She has no cervical adenopathy.  Neurological: She is alert and oriented to person, place, and time. No cranial nerve deficit.  Skin: Skin is warm and dry. No rash noted. No erythema. No pallor.  Psychiatric: She has a normal mood and affect. Her behavior is normal. Judgment and thought content normal.    ED Course  Procedures (including critical care time)  Labs Reviewed  ETHANOL - Abnormal; Notable for the following:    Alcohol, Ethyl (B) 114 (*)     All other components within normal limits  CBC - Abnormal; Notable for the following:    Hemoglobin 11.2 (*)     HCT 34.1 (*)     RDW 18.2 (*)     All other components within normal limits  DIFFERENTIAL - Abnormal; Notable for the following:    Neutrophils Relative 33 (*)     Neutro Abs 1.6 (*)     Lymphocytes Relative 53 (*)     All other components within normal limits  BASIC METABOLIC PANEL - Abnormal; Notable for the following:    Potassium 2.6 (*)     BUN 4 (*)     All other  components within normal limits  MAGNESIUM  URINE RAPID DRUG SCREEN (HOSP PERFORMED)   No results found.  Results for orders placed during the hospital encounter of 11/09/11  ETHANOL      Component Value Range   Alcohol, Ethyl (B) 114 (*) 0 - 11 mg/dL  CBC      Component Value Range   WBC 4.9  4.0 - 10.5 K/uL   RBC 4.26  3.87 - 5.11 MIL/uL   Hemoglobin 11.2 (*) 12.0 - 15.0 g/dL   HCT 13.0 (*) 86.5 - 78.4 %   MCV 80.0  78.0 - 100.0 fL   MCH  26.3  26.0 - 34.0 pg   MCHC 32.8  30.0 - 36.0 g/dL   RDW 43.3 (*) 29.5 - 18.8 %   Platelets 382  150 - 400 K/uL  DIFFERENTIAL      Component Value Range   Neutrophils Relative 33 (*) 43 - 77 %   Neutro Abs 1.6 (*) 1.7 - 7.7 K/uL   Lymphocytes Relative 53 (*) 12 - 46 %   Lymphs Abs 2.6  0.7 - 4.0 K/uL   Monocytes Relative 9  3 - 12 %   Monocytes Absolute 0.5  0.1 - 1.0 K/uL   Eosinophils Relative 3  0 - 5 %   Eosinophils Absolute 0.2  0.0 - 0.7 K/uL   Basophils Relative 1  0 - 1 %   Basophils Absolute 0.1  0.0 - 0.1 K/uL  BASIC METABOLIC PANEL      Component Value Range   Sodium 141  135 - 145 mEq/L   Potassium 2.6 (*) 3.5 - 5.1 mEq/L   Chloride 101  96 - 112 mEq/L   CO2 27  19 - 32 mEq/L   Glucose, Bld 84  70 - 99 mg/dL   BUN 4 (*) 6 - 23 mg/dL   Creatinine, Ser 4.16  0.50 - 1.10 mg/dL   Calcium 8.8  8.4 - 60.6 mg/dL   GFR calc non Af Amer >90  >90 mL/min   GFR calc Af Amer >90  >90 mL/min  MAGNESIUM      Component Value Range   Magnesium 1.9  1.5 - 2.5 mg/dL   No results found.   Alcohol Abuse Suicidal Ideation   MDM   Review of patient's labs reveal hypokalemia - we have replaced the potassium, otherwise the patient is medically clear - will write holding orders as well.  Still compains of shaking and is requesting ativan.       Izola Price [place], [place] 11/09/11 [phone]

## 2011-11-09 NOTE — ED Notes (Signed)
Pt nauseated.  Given nausea medication.

## 2011-11-09 NOTE — ED Notes (Signed)
Patient states she is nauseated and vomited x1.  CIWA scale done at this time.  Patient medicated per CIWA protocol.  Patient given Zofran for nausea.

## 2011-11-09 NOTE — ED Notes (Signed)
Pt reports chronic alcoholism. States she has intermittent periods of sobriety, but has been drinking 1 pint of brandy daily x 1 month. States last drink was 11/08/11. Pt's only complaint at this time is nausea. Reports significant relief following ativan. Pt denies s/i h/i.

## 2011-11-09 NOTE — ED Notes (Signed)
Pt unable to provide urine specimen at this time. Pt given water and aware to call when able to provide specimen.

## 2011-11-09 NOTE — ED Notes (Signed)
Patient given water

## 2011-11-09 NOTE — ED Notes (Signed)
Pt talked with family told to go home.  Pt still waiting to be seen

## 2011-11-10 ENCOUNTER — Inpatient Hospital Stay (HOSPITAL_COMMUNITY)
Admission: AD | Admit: 2011-11-10 | Discharge: 2011-11-18 | DRG: 896 | Disposition: A | Discharge status: Home / Self Care | Payer: 59 | Source: Ambulatory Visit | Attending: Psychiatry | Admitting: Psychiatry

## 2011-11-10 DIAGNOSIS — F1310 Sedative, hypnotic or anxiolytic abuse, uncomplicated: Secondary | ICD-10-CM | POA: Diagnosis present

## 2011-11-10 DIAGNOSIS — F6812 Factitious disorder with predominantly physical signs and symptoms: Secondary | ICD-10-CM | POA: Diagnosis present

## 2011-11-10 DIAGNOSIS — F10988 Alcohol use, unspecified with other alcohol-induced disorder: Principal | ICD-10-CM | POA: Diagnosis present

## 2011-11-10 DIAGNOSIS — Z9149 Other personal history of psychological trauma, not elsewhere classified: Secondary | ICD-10-CM

## 2011-11-10 DIAGNOSIS — K859 Acute pancreatitis without necrosis or infection, unspecified: Secondary | ICD-10-CM | POA: Diagnosis not present

## 2011-11-10 DIAGNOSIS — F10939 Alcohol use, unspecified with withdrawal, unspecified: Secondary | ICD-10-CM | POA: Diagnosis present

## 2011-11-10 DIAGNOSIS — F1020 Alcohol dependence, uncomplicated: Secondary | ICD-10-CM | POA: Diagnosis present

## 2011-11-10 DIAGNOSIS — F10239 Alcohol dependence with withdrawal, unspecified: Secondary | ICD-10-CM | POA: Diagnosis present

## 2011-11-10 DIAGNOSIS — F1210 Cannabis abuse, uncomplicated: Secondary | ICD-10-CM | POA: Diagnosis present

## 2011-11-10 DIAGNOSIS — Z79899 Other long term (current) drug therapy: Secondary | ICD-10-CM

## 2011-11-10 DIAGNOSIS — K2270 Barrett's esophagus without dysplasia: Secondary | ICD-10-CM | POA: Diagnosis present

## 2011-11-10 LAB — POTASSIUM: Potassium: 3 mEq/L — ABNORMAL LOW (ref 3.5–5.1)

## 2011-11-10 MED ORDER — SUCRALFATE 1 G PO TABS
1.0000 g | ORAL_TABLET | Freq: Three times a day (TID) | ORAL | Status: DC
  Administered 2011-11-10 – 2011-11-11 (×2): 1 g via ORAL
  Filled 2011-11-10 (×7): qty 1

## 2011-11-10 MED ORDER — HYDROXYZINE HCL 25 MG PO TABS
25.0000 mg | ORAL_TABLET | Freq: Four times a day (QID) | ORAL | Status: DC | PRN
  Administered 2011-11-10 – 2011-11-11 (×2): 25 mg via ORAL
  Filled 2011-11-10: qty 1

## 2011-11-10 MED ORDER — CHLORDIAZEPOXIDE HCL 25 MG PO CAPS
25.0000 mg | ORAL_CAPSULE | Freq: Every day | ORAL | Status: AC
  Administered 2011-11-15: 25 mg via ORAL
  Filled 2011-11-10: qty 1

## 2011-11-10 MED ORDER — TRAZODONE HCL 50 MG PO TABS
50.0000 mg | ORAL_TABLET | Freq: Every evening | ORAL | Status: DC | PRN
  Administered 2011-11-10: 50 mg via ORAL
  Filled 2011-11-10: qty 1

## 2011-11-10 MED ORDER — ADULT MULTIVITAMIN W/MINERALS CH
1.0000 | ORAL_TABLET | Freq: Every day | ORAL | Status: DC
  Administered 2011-11-11 – 2011-11-18 (×8): 1 via ORAL
  Filled 2011-11-10 (×10): qty 1

## 2011-11-10 MED ORDER — POTASSIUM CHLORIDE CRYS ER 20 MEQ PO TBCR
40.0000 meq | EXTENDED_RELEASE_TABLET | Freq: Once | ORAL | Status: AC
  Administered 2011-11-10: 40 meq via ORAL
  Filled 2011-11-10: qty 2

## 2011-11-10 MED ORDER — PANTOPRAZOLE SODIUM 40 MG PO TBEC: 80.0000 mg | DELAYED_RELEASE_TABLET | Freq: Every day | ORAL | Status: DC

## 2011-11-10 MED ORDER — MAGNESIUM HYDROXIDE 400 MG/5ML PO SUSP: 30.0000 mL | Freq: Every day | ORAL | Status: DC | PRN

## 2011-11-10 MED ORDER — THIAMINE HCL 100 MG/ML IJ SOLN: 100.0000 mg | Freq: Once | INTRAMUSCULAR | Status: DC

## 2011-11-10 MED ORDER — CHLORDIAZEPOXIDE HCL 25 MG PO CAPS
25.0000 mg | ORAL_CAPSULE | Freq: Four times a day (QID) | ORAL | Status: AC | PRN
  Administered 2011-11-11 – 2011-11-13 (×5): 25 mg via ORAL
  Filled 2011-11-10 (×4): qty 1

## 2011-11-10 MED ORDER — POTASSIUM CHLORIDE CRYS ER 20 MEQ PO TBCR
EXTENDED_RELEASE_TABLET | ORAL | Status: AC
  Filled 2011-11-10: qty 1

## 2011-11-10 MED ORDER — ALUM & MAG HYDROXIDE-SIMETH 200-200-20 MG/5ML PO SUSP: 30.0000 mL | ORAL | Status: DC | PRN

## 2011-11-10 MED ORDER — CHLORDIAZEPOXIDE HCL 25 MG PO CAPS
25.0000 mg | ORAL_CAPSULE | Freq: Three times a day (TID) | ORAL | Status: AC
  Administered 2011-11-12 – 2011-11-13 (×3): 25 mg via ORAL
  Filled 2011-11-10 (×4): qty 1

## 2011-11-10 MED ORDER — MAGNESIUM HYDROXIDE 400 MG/5ML PO SUSP
30.0000 mL | Freq: Every day | ORAL | Status: DC | PRN
  Administered 2011-11-17: 30 mL via ORAL

## 2011-11-10 MED ORDER — CITALOPRAM HYDROBROMIDE 10 MG PO TABS
10.0000 mg | ORAL_TABLET | Freq: Every day | ORAL | Status: DC
  Administered 2011-11-11 – 2011-11-14 (×4): 10 mg via ORAL
  Filled 2011-11-10 (×5): qty 1

## 2011-11-10 MED ORDER — PANTOPRAZOLE SODIUM 40 MG PO TBEC
40.0000 mg | DELAYED_RELEASE_TABLET | Freq: Once | ORAL | Status: DC
  Filled 2011-11-10: qty 1

## 2011-11-10 MED ORDER — LOPERAMIDE HCL 2 MG PO CAPS: 2.0000 mg | ORAL_CAPSULE | ORAL | Status: AC | PRN

## 2011-11-10 MED ORDER — ACETAMINOPHEN 325 MG PO TABS: 650.0000 mg | ORAL_TABLET | Freq: Four times a day (QID) | ORAL | Status: DC | PRN

## 2011-11-10 MED ORDER — CHLORDIAZEPOXIDE HCL 25 MG PO CAPS
25.0000 mg | ORAL_CAPSULE | ORAL | Status: AC
  Administered 2011-11-13 – 2011-11-14 (×2): 25 mg via ORAL
  Filled 2011-11-10 (×2): qty 1

## 2011-11-10 MED ORDER — ONDANSETRON 4 MG PO TBDP
4.0000 mg | ORAL_TABLET | Freq: Four times a day (QID) | ORAL | Status: AC | PRN
  Administered 2011-11-11 – 2011-11-13 (×5): 4 mg via ORAL
  Filled 2011-11-10 (×3): qty 1

## 2011-11-10 MED ORDER — PANTOPRAZOLE SODIUM 40 MG PO TBEC
80.0000 mg | DELAYED_RELEASE_TABLET | Freq: Every day | ORAL | Status: DC
  Administered 2011-11-11 – 2011-11-12 (×2): 80 mg via ORAL
  Filled 2011-11-10 (×5): qty 2

## 2011-11-10 MED ORDER — CHLORDIAZEPOXIDE HCL 25 MG PO CAPS
25.0000 mg | ORAL_CAPSULE | Freq: Four times a day (QID) | ORAL | Status: AC
  Administered 2011-11-10 – 2011-11-12 (×6): 25 mg via ORAL
  Filled 2011-11-10 (×6): qty 1

## 2011-11-10 MED ORDER — NICOTINE 14 MG/24HR TD PT24
14.0000 mg | MEDICATED_PATCH | Freq: Every day | TRANSDERMAL | Status: DC
  Administered 2011-11-10 – 2011-11-18 (×9): 14 mg via TRANSDERMAL
  Filled 2011-11-10 (×11): qty 1

## 2011-11-10 MED ORDER — ALUM & MAG HYDROXIDE-SIMETH 200-200-20 MG/5ML PO SUSP
30.0000 mL | ORAL | Status: DC | PRN
  Administered 2011-11-11 – 2011-11-14 (×6): 30 mL via ORAL

## 2011-11-10 MED ORDER — ACETAMINOPHEN 325 MG PO TABS
650.0000 mg | ORAL_TABLET | Freq: Four times a day (QID) | ORAL | Status: DC | PRN
  Administered 2011-11-13 – 2011-11-18 (×5): 650 mg via ORAL

## 2011-11-10 MED ORDER — VITAMIN B-1 100 MG PO TABS
100.0000 mg | ORAL_TABLET | Freq: Every day | ORAL | Status: DC
  Administered 2011-11-11 – 2011-11-18 (×8): 100 mg via ORAL
  Filled 2011-11-10 (×10): qty 1

## 2011-11-10 NOTE — Progress Notes (Signed)
D: Patient cooperative and anxious upon interview.  Patient became tearful when talking about why she is here at [facility]. Patient states she needs to do better when it comes to relationships. Patient concerned about medications she takes for Barrett's Esophagus.  A: Dr. Dan Humphreys called by writer to update patients medication. Orders received for Carafate.  Patient encouraged to go to [facility].  Staff to monitor Q 15 mins for safety. R: Orders received for Carafate. Patient pleasant and cooperative. Patient attended karaoke and interacted with in milieu.  Patient remains safe.

## 2011-11-10 NOTE — ED Notes (Signed)
ACT team member stated BH needs a pregnancy test done before acceptance.

## 2011-11-10 NOTE — ED Provider Notes (Signed)
Pt accepted at [facility] by Dr. Catha Brow for alcoholism and depression.    Melody Alexander. Melody Biebel, MD 11/10/11 1424

## 2011-11-10 NOTE — ED Notes (Signed)
Pt. Signed EMTALA form

## 2011-11-10 NOTE — BH Assessment (Addendum)
Assessment Note   Melody Alexander is an 36 y.o. female that was reassessed this day.  Pt continues to endorse passive SI and is requesting detox from alcohol.  Pt denies HI or psychosis.  Pt is currently being ran for possible admission at [facility].  Received call from [facility] stating pt accepted [facility] by Dr. Dan Humphreys to Dr. Catha Brow to bed 303-2 and could be transported pending begative pregnancy test.  Completed support paperwork.  Updated EDP and ED staff.  Completed reassessment, assessment notification and faxed to [facility] to log.  ED staff to arrange transport via security, as pt is voluntary. Previous Note:   Melody Alexander is an 37 y.o. female that presented to [facility] ED to address her relapsing on alcohol. Pt reports drinking 1 pint of Brandy for two weeks after almost a year of sobriety. Pt is also positive for Cannibus Abuse and reports occassional use, last use several days ago. Pt further admits a history of depression and anxiety-related symptoms. Pt voices taking Prozac and Xanax for "mental health issues" for years, as well. Pt had a previous admission to [facility] in 2008 for a previous suicide attempt and substance abuse. Pt denies active suicidal ideation but does voice increasing hopelessness and helplessness. Pt currently states that she is nauseous, has sweats, headache, and shakiness. CIWA is currently a 11. Patient would like to be considered for inpatient treatment for detox and mental health symptoms.    Axis I: Depressive Disorder NOS and Alcohol Abuse, Cannabis Abuse Axis II: Deferred Axis III:  Past Medical History  Diagnosis Date  . Alcohol addiction   . Barrett esophagus   . Acid reflux   . Depression    Axis IV: other psychosocial or environmental problems, problems related to social environment and problems with primary support group Axis V: 21-30 behavior considerably influenced by delusions or hallucinations OR serious impairment in judgment, communication OR inability to  function in almost all areas  Past Medical History:  Past Medical History  Diagnosis Date  . Alcohol addiction   . Barrett esophagus   . Acid reflux   . Depression     Past Surgical History  Procedure Date  . Tonsillectomy     Family History: History reviewed. No pertinent family history.  Social History:  reports that she has been smoking.  She does not have any smokeless tobacco history on file. She reports that she drinks alcohol. She reports that she uses illicit drugs (Marijuana).  Additional Social History:  Alcohol / Drug Use Pain Medications: Yes Prescriptions: Yes Over the Counter: No History of alcohol / drug use?: Yes Longest period of sobriety (when/how long): unknown Negative Consequences of Use: Personal relationships Withdrawal Symptoms: Nausea / Vomiting Substance #1 Name of Substance 1: ETOH 1 - Age of First Use: 18 1 - Amount (size/oz): 1 pint + for past two weeks 1 - Frequency: QD 1 - Duration: for past two weeks/sober for one year prior 1 - Last Use / Amount: 11/08/2011 Substance #2 Name of Substance 2: Cannibus 2 - Age of First Use: 18 2 - Amount (size/oz): occassional use 2 - Frequency: weeks 2 - Duration: years 2 - Last Use / Amount: 10/22/2011  CIWA: CIWA-Ar BP: 110/67 mmHg Pulse Rate: 67  Nausea and Vomiting: mild nausea with no vomiting Tactile Disturbances: none Tremor: not visible, but can be felt fingertip to fingertip Auditory Disturbances: not present Paroxysmal Sweats: three Visual Disturbances: not present Anxiety: no anxiety, at ease Headache, Fullness in Head:  none present Agitation: normal activity Orientation and Clouding of Sensorium: oriented and can do serial additions CIWA-Ar Total: 5  COWS:    Allergies: No Known Allergies  Home Medications:  (Not in a hospital admission)  OB/GYN Status:  No LMP recorded. Patient has had an implant.  General Assessment Data Location of Assessment: [facility] ED Living Arrangements:  Children Can pt return to current living arrangement?: Yes Admission Status: Voluntary Is patient capable of signing voluntary admission?: Yes Transfer from: Acute Hospital Referral Source: Self/Family/Friend  Education Status Is patient currently in school?: No  Risk to self Suicidal Ideation: Yes-Currently Present Suicidal Intent: No-Not Currently/Within Last 6 Months Is patient at risk for suicide?: Yes Suicidal Plan?: No-Not Currently/Within Last 6 Months Access to Means: No What has been your use of drugs/alcohol within the last 12 months?: 2 weeks of daily liquor use, occasional THC use Previous Attempts/Gestures: Yes How many times?: 1  Other Self Harm Risks: impulsive, reactive Triggers for Past Attempts: Unpredictable Intentional Self Injurious Behavior: None (pt denies) Family Suicide History: No Recent stressful life event(s): Recent negative physical changes Persecutory voices/beliefs?: No Depression: Yes Depression Symptoms: Insomnia;Fatigue;Guilt;Loss of interest in usual pleasures;Feeling worthless/self pity Substance abuse history and/or treatment for substance abuse?: Yes Suicide prevention information given to non-admitted patients: Not applicable  Risk to Others Homicidal Ideation: No Thoughts of Harm to Others: No Current Homicidal Intent: No Current Homicidal Plan: No Access to Homicidal Means: No Identified Victim: na History of harm to others?: No Assessment of Violence: None Noted Violent Behavior Description: na Does patient have access to weapons?: No Criminal Charges Pending?: No Does patient have a court date: No  Psychosis Hallucinations: None noted Delusions: None noted  Mental Status Report Appear/Hygiene: Disheveled Eye Contact: Good Motor Activity: Unremarkable Speech: Logical/coherent Level of Consciousness: Alert Mood: Depressed;Anxious Affect: Depressed;Anxious;Appropriate to circumstance Anxiety Level: Moderate Thought  Processes: Coherent;Relevant Judgement: Impaired Orientation: Person;Place;Time;Situation Obsessive Compulsive Thoughts/Behaviors: None  Cognitive Functioning Concentration: Normal Memory: Recent Intact;Remote Intact IQ: Average Insight: Good Impulse Control: Poor Appetite: Fair Weight Loss: 20  Weight Gain: 0  Sleep: Decreased Total Hours of Sleep: 2  Vegetative Symptoms: None  ADLScreening [facility] Assessment Services) Patient's cognitive ability adequate to safely complete daily activities?: Yes Patient able to express need for assistance with ADLs?: Yes Independently performs ADLs?: Yes  Abuse/Neglect [facility]) Physical Abuse: Denies Verbal Abuse: Denies Sexual Abuse: Denies  Prior Inpatient Therapy Prior Inpatient Therapy: Yes Prior Therapy Dates: 2012; 2008 Prior Therapy Facilty/Provider(s): [facility]; [facility] Reason for Treatment: substance abuse; depression  Prior Outpatient Therapy Prior Outpatient Therapy: No Prior Therapy Dates: n/a Prior Therapy Facilty/Provider(s): n/a Reason for Treatment: n/a  ADL Screening (condition at time of admission) Patient's cognitive ability adequate to safely complete daily activities?: Yes Patient able to express need for assistance with ADLs?: Yes Independently performs ADLs?: Yes Weakness of Legs: None Weakness of Arms/Hands: None  Home Assistive Devices/Equipment Home Assistive Devices/Equipment: None    Abuse/Neglect Assessment (Assessment to be complete while patient is alone) Physical Abuse: Denies Verbal Abuse: Denies Sexual Abuse: Denies Exploitation of patient/patient's resources: Denies Self-Neglect: Denies Values / Beliefs Cultural Requests During Hospitalization: None Spiritual Requests During Hospitalization: None Consults Spiritual Care Consult Needed: No Social Work Consult Needed: No Merchant navy officer (For Healthcare) Advance Directive: Patient does not have advance directive Nutrition Screen Diet:  Regular  Additional Information 1:1 In Past 12 Months?: No CIRT Risk: No Elopement Risk: No Does patient have medical clearance?: Yes     Disposition:  Pt accepted  [facility]  On Site Evaluation by:   Reviewed with Physician:     Caryl Comes 11/10/2011 12:47 PM

## 2011-11-10 NOTE — ED Notes (Signed)
Pt signed EMTALA

## 2011-11-10 NOTE — Tx Team (Signed)
Initial Interdisciplinary Treatment Plan  PATIENT STRENGTHS: (choose at least two) Ability for insight Capable of independent living Communication skills General fund of knowledge Supportive family/friends  PATIENT STRESSORS: Health problems Marital or family conflict Substance abuse   PROBLEM LIST: Problem List/Patient Goals Date to be addressed Date deferred Reason deferred Estimated date of resolution  depression      Alcohol abuse                                                 DISCHARGE CRITERIA:  Improved stabilization in mood, thinking, and/or behavior Need for constant or close observation no longer present Verbal commitment to aftercare and medication compliance Withdrawal symptoms are absent or subacute and managed without 24-hour nursing intervention  PRELIMINARY DISCHARGE PLAN: Attend aftercare/continuing care group Attend 12-step recovery group  PATIENT/FAMIILY INVOLVEMENT: This treatment plan has been presented to and reviewed with the patient, Melody Alexander, and/or family member, .  The patient and family have been given the opportunity to ask questions and make suggestions.  Noah Charon 11/10/2011, 6:06 PM
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
## 2011-11-10 NOTE — ED Notes (Signed)
Call dietary to put in for a lunch tray

## 2011-11-10 NOTE — ED Notes (Signed)
Pt resting at this time.

## 2011-11-10 NOTE — ED Provider Notes (Signed)
Medical screening examination/treatment/procedure(s) were performed by non-physician practitioner and as supervising physician I was immediately available for consultation/collaboration.    Vida Roller, MD 11/10/11 [phone]

## 2011-11-10 NOTE — ED Notes (Signed)
Pt. States "I just don't feel good, I'm sweaty and some of the medications make me tired". Pt. Given water and morning medications. Pt. Denies needing anything further.

## 2011-11-10 NOTE — ED Notes (Signed)
Pt. In room crying saying "I'm a little anxious about this whole process.  I hope that I can get it right this time". Pt. Express want and need to detox, "especially for my son. I need to set a better example for him". States "I just need a different outlet to deal with my emotions".  Listened to pt and provided support.

## 2011-11-11 DIAGNOSIS — F1020 Alcohol dependence, uncomplicated: Secondary | ICD-10-CM

## 2011-11-11 DIAGNOSIS — K209 Esophagitis, unspecified without bleeding: Secondary | ICD-10-CM | POA: Diagnosis present

## 2011-11-11 DIAGNOSIS — F332 Major depressive disorder, recurrent severe without psychotic features: Secondary | ICD-10-CM

## 2011-11-11 DIAGNOSIS — F1010 Alcohol abuse, uncomplicated: Secondary | ICD-10-CM

## 2011-11-11 MED ORDER — CARBAMAZEPINE ER 200 MG PO TB12
200.0000 mg | ORAL_TABLET | Freq: Two times a day (BID) | ORAL | Status: DC
  Administered 2011-11-11 – 2011-11-18 (×15): 200 mg via ORAL
  Filled 2011-11-11 (×10): qty 1
  Filled 2011-11-11: qty 28
  Filled 2011-11-11 (×2): qty 1
  Filled 2011-11-11: qty 28
  Filled 2011-11-11 (×5): qty 1

## 2011-11-11 MED ORDER — VITAMIN B-12 500 MCG PO TABS
500.0000 ug | ORAL_TABLET | Freq: Every day | ORAL | Status: DC
  Administered 2011-11-11 – 2011-11-18 (×8): 500 ug via ORAL
  Filled 2011-11-11 (×10): qty 1

## 2011-11-11 MED ORDER — CARBAMAZEPINE ER 200 MG PO CP12: 200.0000 mg | ORAL_CAPSULE | Freq: Two times a day (BID) | ORAL | Status: DC

## 2011-11-11 MED ORDER — GABAPENTIN 100 MG PO CAPS
200.0000 mg | ORAL_CAPSULE | Freq: Three times a day (TID) | ORAL | Status: DC
  Administered 2011-11-11 – 2011-11-12 (×4): 200 mg via ORAL
  Filled 2011-11-11 (×9): qty 2

## 2011-11-11 MED ORDER — POTASSIUM CHLORIDE 10 MEQ PO TBCR
20.0000 meq | EXTENDED_RELEASE_TABLET | Freq: Two times a day (BID) | ORAL | Status: DC
  Administered 2011-11-11 – 2011-11-18 (×15): 20 meq via ORAL
  Filled 2011-11-11 (×20): qty 2

## 2011-11-11 MED ORDER — TRAZODONE HCL 100 MG PO TABS
100.0000 mg | ORAL_TABLET | Freq: Every evening | ORAL | Status: DC | PRN
  Administered 2011-11-11 – 2011-11-17 (×7): 100 mg via ORAL
  Filled 2011-11-11 (×7): qty 1

## 2011-11-11 MED ORDER — SUCRALFATE 1 G PO TABS
1.0000 g | ORAL_TABLET | Freq: Three times a day (TID) | ORAL | Status: DC
  Administered 2011-11-11 – 2011-11-14 (×9): 1 g via ORAL
  Filled 2011-11-11 (×12): qty 1

## 2011-11-11 MED ORDER — PANTOPRAZOLE SODIUM 40 MG PO TBEC: 80.0000 mg | DELAYED_RELEASE_TABLET | Freq: Every day | ORAL | Status: DC

## 2011-11-11 MED ORDER — HYDROXYZINE HCL 25 MG PO TABS
25.0000 mg | ORAL_TABLET | ORAL | Status: DC | PRN
  Administered 2011-11-12 – 2011-11-18 (×14): 25 mg via ORAL
  Filled 2011-11-11 (×7): qty 1

## 2011-11-11 NOTE — Progress Notes (Signed)
[facility] Group Notes:  (Counselor/Nursing/MHT/Case Management/Adjunct)  11/11/2011 4:31 PM  Type of Therapy:  Group Therapy at 11  Participation Level:  Minimal  Participation Quality:  Attentive  Affect:  Appropriate and tearful  Cognitive:  Alert and Oriented  Insight:  Limited  Engagement in Group:  Limited  Engagement in Therapy:  Limited  Modes of Intervention:  Education, Socialization and Support  Summary of Progress/Problems: States he was attentive to group educational segment on post acute withdrawal syndrome. She became somewhat emotional and tearful during discussion of families and family triggers. She shared "they just don't understand they wouldn't believe this; but is like to have a copy for them."    Clide Dales 11/11/2011, 4:31 PM [facility] Group Notes:  (Counselor/Nursing/MHT/Case Management/Adjunct)  11/11/2011 4:34 PM  Type of Therapy:  Group Therapy  Participation Level:  Minimal  Participation Quality:  Attentive  Affect:  Depressed  Cognitive:  Alert and Oriented  Insight:  None shared  Engagement in Group:  Limited  Engagement in Therapy:  Limited  Modes of Intervention:  Clarification and Socialization  Summary of Progress/Problems:  Carlina did not share but was actively listening throughout discussion on vulnerability.     Clide Dales 11/11/2011, 4:34 PM

## 2011-11-11 NOTE — Progress Notes (Signed)
(  D)Pt has been blunted in affect, depressed in mood, anxious at times. Pt shared that she has Barrett esophagus with her acid reflux. Pt reported when she was drinking she didn't notice as much of an issue with her reflux but has had increased irritation this evening. Pt did have withdrawal symptoms of tremors of her hands which was relieved with PRN Librium. (A)Support and encouragement given. PRN medications given for symptoms. Education on a bland diet for stomach symptoms done. (R)Pt receptive. Pt reported decrease in symptoms. Pt attending all groups and activities.
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
## 2011-11-11 NOTE — Discharge Planning (Signed)
Another case manager met with patient in Aftercare Planning Group, on the 300 hall.  She felt bad, is going through a rough detox.  Expressed no case management needs today.  She has signed consent for staff to talk to both her mother and father.  Ambrose Mantle, LCSW 11/11/2011, 1:25 PM

## 2011-11-11 NOTE — Progress Notes (Addendum)
(  D) Patient reports sleep as poor, appetite improving, energy level low, ability to pay attention good, depression 10/10 and feelings of hopelessness 10/10. Withdrawal symptom endorsed include: tremors, diarrhea, cravings, agitation and chilling. Denies SI or psychosis. (A) Patient educated on unit routine and group participation expectations. Patient encouraged to increase fluid intake and offered fluids. PRN Librium and Zofran given for withdrawal symptoms. Dressing on theft forefinger removed and redressed. Sutures intact, edges well approximated. No drainage. (R) Patient receptive to encouragement. Joice Lofts RN MS EdS 11/11/2011  10:06 AM
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
## 2011-11-12 DIAGNOSIS — F1020 Alcohol dependence, uncomplicated: Secondary | ICD-10-CM

## 2011-11-12 MED ORDER — ESOMEPRAZOLE MAGNESIUM 40 MG PO CPDR
40.0000 mg | DELAYED_RELEASE_CAPSULE | ORAL | Status: DC
  Administered 2011-11-13 – 2011-11-18 (×6): 40 mg via ORAL
  Filled 2011-11-12 (×9): qty 1

## 2011-11-12 MED ORDER — BOOST PLUS PO LIQD
237.0000 mL | Freq: Three times a day (TID) | ORAL | Status: DC
  Administered 2011-11-12 – 2011-11-16 (×9): 237 mL via ORAL
  Filled 2011-11-12 (×20): qty 237

## 2011-11-12 MED ORDER — GABAPENTIN 300 MG PO CAPS
300.0000 mg | ORAL_CAPSULE | Freq: Three times a day (TID) | ORAL | Status: DC
  Administered 2011-11-12 – 2011-11-14 (×5): 300 mg via ORAL
  Filled 2011-11-12 (×9): qty 1

## 2011-11-12 MED ORDER — NON FORMULARY: 40.0000 mg | Status: DC

## 2011-11-12 NOTE — Progress Notes (Signed)
Pt pleasant on approach, very concerned about her continued vomiting.  States that Protonix does not work for her. Pt requests that she be ordered her nexium that she takes at home.  Pt states that is the only thing that works for her.  Doctor on call notified of this and order received for her to be able to take it.  Pt very pleased with this.  Pt denies SI/HI/hallucinations.  Pt positive for evening AA group tonight.  Support and encouragement offered, will continue to monitor.

## 2011-11-12 NOTE — Progress Notes (Signed)
Patient ID: Melody Alexander, female   DOB: [birth year], 36 y.o.   MRN: [id number]  [facility] Group Notes:  (Counselor/Nursing/MHT/Case Management/Adjunct)  11/12/2011 1:15 PM  Type of Therapy:  Group Therapy, Dance/Movement Therapy   Participation Level:  Minimal  Participation Quality:  Appropriate  Affect:  Appropriate  Cognitive:  Appropriate  Insight:  None  Engagement in Group:  Limited  Engagement in Therapy:  Limited  Modes of Intervention:  Clarification, Problem-solving, Role-play, Socialization and Support  Summary of Progress/Problems:  Therapist discussed the term self-sabotage and enabling.  Therapist asked group to discuss the role self-sabotage plays in their lives.  Therapist asked group to describe how enabler's can sabotage the recovery process.  Therapist asked pt. what do we do to sabotage ourselves and way.  Therapist asked pt. to name one healthy coping mechanism to apply when wanting to self sabotage.  Pt. stated "Self sabotage means destroying yourself and knowing when things are wrong but doing it anyway.   Pt. stated "I have a lot of enabler's'.   Rhunette Croft

## 2011-11-12 NOTE — Progress Notes (Signed)
Patient ID: Melody Alexander, female   DOB: 19-Jun-1975, 36 y.o.   MRN: [id number]  Pt. attended and participated in aftercare planning group. Pt. accepted information on suicide prevention, warning signs to look for with suicide and crisis line numbers to use. Pt. listed their current anxiety level as 10 and their current depression level as 8. Pt. feels anxious about everything going on in her life but stated that she feels safe at the hospital.

## 2011-11-12 NOTE — Progress Notes (Signed)
Patient ID: Melody Alexander, female   DOB: 1975-09-09, 36 y.o.   MRN: [id number] D: Pt. Lying in bed, resp. Even, no distress noted. A: Writer monitored for s/s of detox and resp. Distress. R: Safety will be maintained with q24min checks.

## 2011-11-12 NOTE — Progress Notes (Signed)
D slept well last nite, appetite is improving, energy level is low and ability to pay attention is good, depressed 8/10 and hopeless 10/10, denies Si or Hi, taking meds as ordered by MD, attending group, interacting w/peers in dayroom, is working on her depression at this time A q77min safety checks continue and support offered R safety maintained
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
## 2011-11-12 NOTE — Progress Notes (Signed)
[facility] Family/Significant Other Suicide Prevention Education  Suicide Prevention Education:  Education Completed; Rashelle Ireland (mother) [phone],   has been identified by the patient as the family member/significant other with whom the patient will be residing, and identified as the person(s) who will aid the patient in the event of a mental health crisis (suicidal ideations/suicide attempt).  With written consent from the patient, the family member/significant other has been provided the following suicide prevention education, prior to the and/or following the discharge of the patient.  The suicide prevention education provided includes the following:  Suicide risk factors  Suicide prevention and interventions  National Suicide Hotline telephone number  [facility] assessment telephone number  [facility] Emergency Assistance 911  [facility] and/or Residential Mobile Crisis Unit telephone number  Request made of family/significant other to:  Remove weapons (e.g., guns, rifles, knives), all items previously/currently identified as safety concern.    Remove drugs/medications (over-the-counter, prescriptions, illicit drugs), all items previously/currently identified as a safety concern.  The family member/significant other verbalizes understanding of the suicide prevention education information provided.  The family member/significant other agrees to remove the items of safety concern listed above.  Mother would like to see pt go to long term tx for at least a year. She reports that the pt has been to several tx centers in the past and the longest she has been there is 3 months. Mother feels that she needs to be longer than 3 months she feels that the pt will kill herself if she does not get this. Mother was tearful and "begged" that we get the pt this longer term help.  * Mother reports that the pt is saying she has cancer in her throught. Mother  is not sure if this is true but knows that the pt needs medical help. Mother would like th number of the dr that reported this 10 days ago. States that the number is on the pt.'s cell in her locker. Mother would like to get this number so that she can confirm this and proceed with her treatment. Mother would like a call if this number can be retrieved.  [facility] 11/12/2011, 4:33 PM

## 2011-11-13 LAB — LIPASE, BLOOD: Lipase: 229 U/L — ABNORMAL HIGH (ref 11–59)

## 2011-11-13 LAB — AMYLASE: Amylase: 201 U/L — ABNORMAL HIGH (ref 0–105)

## 2011-11-13 MED ORDER — ONDANSETRON 4 MG PO TBDP
4.0000 mg | ORAL_TABLET | Freq: Four times a day (QID) | ORAL | Status: DC | PRN
  Administered 2011-11-14 – 2011-11-17 (×5): 4 mg via ORAL
  Filled 2011-11-13 (×2): qty 1

## 2011-11-13 NOTE — Progress Notes (Signed)
Patient ID: Melody Alexander, female   DOB: 03/18/1976, 36 y.o.   MRN: [id number]  Pt. did not attend aftercare planning group.

## 2011-11-13 NOTE — Progress Notes (Signed)
D.  Pt reports better day today than yesterday.  Still some vomiting and nausea but states that the Nexium is beginning to work to improve this.  Pt also reports that she normally takes carafate at night as well as in the morning and would like the doctor to consider changing this for her.  Pt is drinking her Boost's.  Attended evening group, interacting appropriately within milieu. Pt denies SI/HI/hallucinations at this time.  A.  Support and encouragement offered, will leave sticky for doctor regarding carafate dosage.  R.  Pt sitting in dayroom watching TV, no distress noted.  Will continue to monitor.

## 2011-11-13 NOTE — Progress Notes (Signed)
Patient ID: Melody Alexander, female   DOB: [birth year], 36 y.o.   MRN: [id number]   [facility] Group Notes:  (Counselor/Nursing/MHT/Case Management/Adjunct)  11/13/2011 1:15 PM  Type of Therapy:  Group Therapy, Dance/Movement Therapy   Participation Level:  Active  Participation Quality:  Appropriate, Attentive and Sharing  Affect:  Appropriate  Cognitive:  Appropriate  Insight:  Good  Engagement in Group:  Good  Engagement in Therapy:  Good  Modes of Intervention:  Clarification, Problem-solving, Role-play, Socialization and Support  Summary of Progress/Problems:  Group discussed the "I Am Your Disease" letter. Group members shared how they could relate to the letter and what supports they could use to aid in their recovery. Pt. had a very emotional response to the letter and could relate very much to the words. Pt. shared her plans and goals with the group and was very attentive.   Cassidi Long

## 2011-11-13 NOTE — Progress Notes (Signed)
D: Pt has been up and has been active throughout the day today, pt did state that she is feeling a little better, pt still endorsing feelings of anxiety and still very tremulous, pt does endorse feelings of depression today but denies any suicidal thoughts, pt also has had episodic bouts of nausea today and has required medications to help. A: Support and encouragement provided during the day, R: Will continue to monitor.
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
## 2011-11-14 MED ORDER — CITALOPRAM HYDROBROMIDE 20 MG PO TABS
20.0000 mg | ORAL_TABLET | Freq: Every day | ORAL | Status: DC
  Administered 2011-11-15 – 2011-11-18 (×4): 20 mg via ORAL
  Filled 2011-11-14 (×3): qty 1
  Filled 2011-11-14: qty 14
  Filled 2011-11-14 (×2): qty 1

## 2011-11-14 MED ORDER — GABAPENTIN 400 MG PO CAPS
400.0000 mg | ORAL_CAPSULE | Freq: Three times a day (TID) | ORAL | Status: DC
  Administered 2011-11-14 – 2011-11-18 (×13): 400 mg via ORAL
  Filled 2011-11-14 (×9): qty 1
  Filled 2011-11-14: qty 42
  Filled 2011-11-14 (×3): qty 1
  Filled 2011-11-14: qty 42
  Filled 2011-11-14 (×2): qty 1
  Filled 2011-11-14: qty 42
  Filled 2011-11-14: qty 1

## 2011-11-14 MED ORDER — ACAMPROSATE CALCIUM 333 MG PO TBEC
666.0000 mg | DELAYED_RELEASE_TABLET | Freq: Three times a day (TID) | ORAL | Status: DC
  Administered 2011-11-14 – 2011-11-18 (×13): 666 mg via ORAL
  Filled 2011-11-14 (×18): qty 2

## 2011-11-14 MED ORDER — KETOROLAC TROMETHAMINE 10 MG PO TABS
10.0000 mg | ORAL_TABLET | Freq: Four times a day (QID) | ORAL | Status: DC | PRN
  Administered 2011-11-14 – 2011-11-17 (×10): 10 mg via ORAL
  Filled 2011-11-14 (×10): qty 1

## 2011-11-14 MED ORDER — SUCRALFATE 1 G PO TABS
1.0000 g | ORAL_TABLET | Freq: Three times a day (TID) | ORAL | Status: DC
  Administered 2011-11-14 – 2011-11-16 (×11): 1 g via ORAL
  Filled 2011-11-14 (×16): qty 1

## 2011-11-14 MED ORDER — CALCIUM CARBONATE ANTACID 500 MG PO CHEW
200.0000 mg | CHEWABLE_TABLET | Freq: Once | ORAL | Status: AC
  Administered 2011-11-14: 200 mg via ORAL
  Filled 2011-11-14: qty 1

## 2011-11-14 MED ORDER — CALCIUM CARBONATE ANTACID 500 MG PO CHEW
200.0000 mg | CHEWABLE_TABLET | Freq: Every day | ORAL | Status: DC
  Administered 2011-11-14 – 2011-11-16 (×2): 200 mg via ORAL
  Filled 2011-11-14 (×5): qty 1

## 2011-11-14 NOTE — Progress Notes (Signed)
Patient ID: Melody Alexander, female   DOB: [birth year], 36 y.o.   MRN: [id number]  4   Melody Alexander 36 y.o. female Dx. Alcoholism        Barrett's esophogitis Subjective:   Melody Alexander is up and active in the unit milieu this morning, but notes that she is still having abdominal pain and vomits each time she tries to eat solid food.  She can keep liquids down.  Melody Alexander reports that she is having a great deal of agitation and is having difficulty keeping still, she notes that she has tremors and they seem to be getting worse.  She voices that she has significant alcohol cravings this morning.  She reports that her upper abdominal pain comes and goes but is at times 10/10.     She reports her sleep is good and her appetite is increasing despite the vomiting.  She rates her depression as a 9/10.  BP 102/73  Pulse 106  Temp 97.3 F (36.3 C) (Oral)  Resp 20  Ht 5\' 3"  (1.6 m)  Wt 52.617 kg (116 lb)  BMI 20.55 kg/m2 Scheduled Meds:   . acamprosate  666 mg Oral TID WC  . calcium carbonate  200 mg of elemental calcium Oral Daily  . carbamazepine  200 mg Oral BID  . chlordiazePOXIDE  25 mg Oral BH-qamhs   Followed by  . chlordiazePOXIDE  25 mg Oral Daily  . citalopram  20 mg Oral Daily  . esomeprazole  40 mg Oral BH-q7a  . gabapentin  400 mg Oral TID  . lactose free nutrition  237 mL Oral TID WC  . multivitamin with minerals  1 tablet Oral Daily  . nicotine  14 mg Transdermal Daily  . potassium chloride  20 mEq Oral BID  . sucralfate  1 g Oral TID WC & HS  . thiamine  100 mg Oral Daily  . vitamin B-12  500 mcg Oral Daily  . DISCONTD: citalopram  10 mg Oral Daily  . DISCONTD: gabapentin  300 mg Oral TID  . DISCONTD: sucralfate  1 g Oral TID AC   PRN Meds:.acetaminophen, alum & mag hydroxide-simeth, chlordiazePOXIDE, hydrOXYzine, ketorolac, loperamide, magnesium hydroxide, ondansetron, ondansetron, traZODone  Objective: She is alert and oriented x3, cooperative and informative. She  appears anxious but is not in distress. Her eye contact is good, her speech is clear and goal directed.  She notes her current symptoms as noted above.  She denies SI/HI is in full contact with reality, and her insight and judgement are improving. Melody Alexander states her upper abdominal pain comes and goes, but does acknowledge some significant discomfort.   Assessment/Plan: 1. Labs are reviewed and amylase and lipase are both significantly elevated. 2. Consulted with [facility] MD on call, who recommended pt. Be NPO for 24 hours with clear liquids only. 3. Treat pain with Toradol 10mg  po q 6hrs prn. 4. Add Campral 666mg  po for alcohol cravings. 5. Increase the Neurontin for anxiety. 6. If no improvement will consider transfer to IP for fluids if needed.     Toure Edmonds, PAC  11/14/2011, 2:47 PM
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
## 2011-11-14 NOTE — Progress Notes (Signed)
D slept well last nite, appetite is improving, energy level is low and ability to pay attention is good, depressed 7/10 and hopeless 8/10 today, denies Si or Hi, taking meds as ordered by MD, going to group and interacting /wpeers, having problems w/stomach today and seen by MD/PA. A q80min safety checks continue and support offered, prn Vistaril, and other new meds begun tdoay R safety maintained

## 2011-11-14 NOTE — Progress Notes (Signed)
[facility] Group Notes:  (Counselor/Nursing/MHT/Case Management/Adjunct)  11/14/2011 4:48 PM  Type of Therapy:  Group Therapy at 11AM  Participation Level:  Minimal  Participation Quality:  Intrusive and Inattentive  Affect:  Anxious  Cognitive:  Alert and Oriented  Insight:  None shared  Engagement in Group:  Limited  Engagement in Therapy:  None as patient left group early in process  Modes of Intervention:  Limit-setting, Orientation and Problem-solving  Summary of Progress/Problems:   Group discussion focused on what patient's see as their own obstacles to recovery. Melody Alexander was part of group conflict as she became reactive (laughing at another) with others who were singling out one person to take out frustrations on.    Melody Alexander left group early and did not return, later reported to writer her stomach was upset.  Clide Dales 11/14/2011, 4:48 PM

## 2011-11-15 NOTE — Progress Notes (Signed)
[facility] Group Notes: (Counselor/Nursing/MHT/Case Management/Adjunct) 11/15/2011   @ 11:00 Feelings About Diagnosis  Type of Therapy:  Group Therapy  Participation Level:  Active  Participation Quality: Appropriate, Sharing, Supportive   Affect:  Appropriate  Cognitive:  Appropriate  Insight:  Good  Engagement in Group: Good  Engagement in Therapy:  Good  Modes of Intervention:  Support and Exploration  Summary of Progress/Problems: Melody Alexander came to group late, but was very engaged. She explored her self-concept, stating that she is passionate, trustworthy, loyal, honest, and motivated. Then she stated that when she is in active addiction she does not make choices congruent with these characteristics. Melody Alexander processed her past experiences with detox and rehab, stating that although she knew that she needed longer term treatment she was too afraid and not ready to get it. This time, she states, she knows it will be hard to be away from her son but she is willing to make that sacrifice in order to make a better life for her and for him. Melody Alexander explored the need for self-honesty, and stated that she has long fooled herself into thinking she has power over her addiction, but she does not; she only has power over whether or not she gives in to it. She was supportive to another group member who discussed the difficulty of facing PTSD memories without the alcohol, and agreed him that although it is hard, it has to happen in order for there to be healing.  Billie Lade 11/15/2011  2:22 PM

## 2011-11-15 NOTE — Progress Notes (Signed)
[facility] MD Progress Note  11/15/2011 3:04 PM  Diagnosis:  Alcoholism                     Barretts esophagitis                     Acute pancreatitis  ADL's:  Intact  Sleep: Good  Appetite:  Good  Suicidal Ideation: no Homicidal Ideation: no   AEB (as evidenced by):  Mental Status Examination/Evaluation: Objective:  Appearance: Casual  Eye Contact::  Good  Speech:  Clear and Coherent  Volume:  Normal  Mood:  Anxious  Affect:  Congruent  Thought Process:  Coherent  Orientation:  Full  Thought Content:  WDL  Suicidal Thoughts:  No  Homicidal Thoughts:  No  Memory:  Immediate;   Fair  Judgement:  Intact  Insight:  Fair  Psychomotor Activity:  Normal  Concentration:  Fair  Recall:  Fair  Akathisia:  No  Handed:  Left  AIMS (if indicated):     Assets:    Sleep:  Number of Hours: 4.25    Vital Signs:Blood pressure 102/72, pulse 109, temperature 98.2 F (36.8 C), temperature source Oral, resp. rate 12, height 5\' 3"  (1.6 m), weight 52.617 kg (116 lb). Current Medications: Current Facility-Administered Medications  Medication Dose Route Frequency Provider Last Rate Last Dose  . acamprosate (CAMPRAL) tablet 666 mg  666 mg Oral TID WC Verne Spurr, PA-C   666 mg at 11/15/11 1156  . acetaminophen (TYLENOL) tablet 650 mg  650 mg Oral Q6H PRN Verne Spurr, PA-C   650 mg at 11/14/11 1717  . alum & mag hydroxide-simeth (MAALOX/MYLANTA) 200-200-20 MG/5ML suspension 30 mL  30 mL Oral Q4H PRN Verne Spurr, PA-C   30 mL at 11/14/11 1858  . calcium carbonate (TUMS - dosed in mg elemental calcium) chewable tablet 200 mg of elemental calcium  200 mg of elemental calcium Oral Daily Verne Spurr, PA-C   200 mg of elemental calcium at 11/14/11 1419  . calcium carbonate (TUMS - dosed in mg elemental calcium) chewable tablet 200 mg of elemental calcium  200 mg of elemental calcium Oral Once Mickeal Skinner, MD   200 mg of elemental calcium at 11/14/11 2201  . carbamazepine (TEGRETOL XR) 12 hr  tablet 200 mg  200 mg Oral BID Alyson Kuroski-Mazzei, DO   200 mg at 11/15/11 0824  . chlordiazePOXIDE (LIBRIUM) capsule 25 mg  25 mg Oral Daily Verne Spurr, PA-C   25 mg at 11/15/11 0824  . citalopram (CELEXA) tablet 20 mg  20 mg Oral Daily Verne Spurr, PA-C   20 mg at 11/15/11 1610  . esomeprazole (NEXIUM) capsule 40 mg  40 mg Oral BH-q7a Nehemiah Settle, MD   40 mg at 11/15/11 0640  . gabapentin (NEURONTIN) capsule 400 mg  400 mg Oral TID Verne Spurr, PA-C   400 mg at 11/15/11 1157  . hydrOXYzine (ATARAX/VISTARIL) tablet 25 mg  25 mg Oral Q4H PRN Verne Spurr, PA-C   25 mg at 11/15/11 0824  . ketorolac (TORADOL) tablet 10 mg  10 mg Oral Q6H PRN Verne Spurr, PA-C   10 mg at 11/15/11 0825  . lactose free nutrition (BOOST PLUS) liquid 237 mL  237 mL Oral TID WC Jorje Guild, PA-C   237 mL at 11/15/11 1156  . magnesium hydroxide (MILK OF MAGNESIA) suspension 30 mL  30 mL Oral Daily PRN Verne Spurr, PA-C      . multivitamin with  minerals tablet 1 tablet  1 tablet Oral Daily Verne Spurr, PA-C   1 tablet at 11/15/11 0825  . nicotine (NICODERM CQ - dosed in mg/24 hours) patch 14 mg  14 mg Transdermal Daily Alyson Kuroski-Mazzei, DO   14 mg at 11/15/11 2130  . ondansetron (ZOFRAN-ODT) disintegrating tablet 4 mg  4 mg Oral Q6H PRN Jorje Guild, PA-C   4 mg at 11/14/11 1000  . potassium chloride (KLOR-CON) CR tablet 20 mEq  20 mEq Oral BID Verne Spurr, PA-C   20 mEq at 11/15/11 0829  . sucralfate (CARAFATE) tablet 1 g  1 g Oral TID WC & HS Verne Spurr, PA-C   1 g at 11/15/11 1157  . thiamine (VITAMIN B-1) tablet 100 mg  100 mg Oral Daily Verne Spurr, PA-C   100 mg at 11/15/11 8657  . traZODone (DESYREL) tablet 100 mg  100 mg Oral QHS PRN Verne Spurr, PA-C   100 mg at 11/14/11 2201  . vitamin B-12 (CYANOCOBALAMIN) tablet 500 mcg  500 mcg Oral Daily Verne Spurr, PA-C   500 mcg at 11/15/11 8469    Lab Results:  Results for orders placed during the hospital encounter of 11/10/11  (from the past 48 hour(s))  LIPASE, BLOOD     Status: Abnormal   Collection Time   11/13/11  7:28 PM      Component Value Range Comment   Lipase 229 (*) 11 - 59 U/L   AMYLASE     Status: Abnormal   Collection Time   11/13/11  7:28 PM      Component Value Range Comment   Amylase 201 (*) 0 - 105 U/L     Physical Findings: AIMS:  CIWA:  CIWA-Ar Total: 1  COWS:     Time is spent with the patient discussing her symptoms and she notes that she does feel better today and has had only one episode of vomiting last night.  Her pain is better and is being treated with Toradol 10mg  po q 6 hours.  She notes that she is hungry and had tried eating some clear soup without difficulty.  IM is consulted who agreed that she could advance her diet to soft foods for the next few days to give this a trial.     She also notes that her tremors are better, and she also states her cravings are much less on the campral.  Treatment Plan  1. Continue current regimen as prescribed with the above changes as noted.  2. Advance diet as tolerated to soft foods.  3. Continue to monitor.   Rona Ravens. Monterius Rolf PAC 11/15/2011, 3:04 PM

## 2011-11-15 NOTE — Progress Notes (Signed)
D:  Patient is here for alcohol detox, she complained of tremors and anxiety this morning requiring medication. CIWA score was 13 requiring medication.  She also was complaining of abdominal pain rated at 4/10 secondary to pancreatitis.  She is rating depression at 8/10 and hopelessness at 8/10.  Denies SI/HI/AVH at this time.   A:  Administered pain and anxiety medication this am and provided emotional support.   R:  Patient stated that her pain and anxiety was much better after medication this am and she was up on the unit participating in groups and interacting with staff and other patients.
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
## 2011-11-15 NOTE — Discharge Planning (Signed)
Melody Alexander attended AM group the past 2 days.  Good participation both days.  Yesterday she shared that shwe had decided over the weekend to go to long term treatment, and requested an application for [facility].  She filled that out last night, and I FAXed it out.  She acknowledged that she knows she will be unable to stay here until she gets into the program, and said that she hopes to stay with her aunt until she can get in.  Plan B would be to stay with parents.  We tried to call her aunt, but she was not available.  Left a message asking her to c/b.

## 2011-11-15 NOTE — Treatment Plan (Signed)
Interdisciplinary Treatment Plan Update (Adult)  Date: 11/15/2011  Time Reviewed: 4:42 PM   Progress in Treatment: Attending groups: Yes Participating in groups: Yes Taking medication as prescribed: Yes Tolerating medication: Yes   Family/Significant other contact made: Still working on it  Patient understands diagnosis:  Yes  As evidenced by asking for help with mood instability and getting into rehab Discussing patient identified problems/goals with staff:  Yes  See below Medical problems stabilized or resolved:  Yes Denies suicidal/homicidal ideation: Yes  On self inventory Issues/concerns per patient self-inventory:  Yes  Depression, anxiety and hopelessness all 8's Other:  New problem(s) identified: N/A  Reason for Continuation of Hospitalization: Anxiety Depression Medication stabilization  Interventions implemented related to continuation of hospitalization: Medication trial for medical, psychiatric issues  Encourage group attendance and participation  Additional comments:  Estimated length of stay: 3-4 days  Discharge Plan: see below  New goal(s): N/A  Review of initial/current patient goals per problem list:   1.  Goal(s): Safely detox from alcohol  Met:  Yes  Target date:6/25  As evidenced [id number] vitals, no withdrawal symptoms  2.  Goal (s): Get into long term treatment  Met:  No  Target date:6/28  As evidenced [id number] hopes to stay with her aunt until she can get into [facility]  3.  Goal(s): Stabilize mood  Met:  No  Target date:6/28  As evidenced [id number] will report that she feels her depression and anxiety are manageable and not overwhelming  4.  Goal(s):  Met:  No  Target date:  As evidenced by:  Attendees: Patient:  Melody Alexander 11/15/2011 4:42 PM  Family:     Physician:  Verne Spurr PA-C 11/15/2011 4:42 PM   Nursing:    11/15/2011 4:42 PM   Case Manager:  Richelle Ito, LCSW 11/15/2011 4:42 PM   Counselor:    11/15/2011 4:42 PM   Other:     Other:     Other:     Other:      Scribe for Treatment Team:   Ida Rogue, 11/15/2011 4:42 PM

## 2011-11-15 NOTE — Progress Notes (Signed)
D: Pt denies SI/HI/AV. Pt is pleasant and cooperative. But can be needy at times and needs to be redirected. Pt is anxious and is still having withdrawal systoms. Complained of nausea, tremors, and anxiety.   A: Pt was offered support and encouragement. Pt was given scheduled medications. Pt was encourage to attend groups. Q 15 minute checks were done for safety.  R: Pt attends groups and interacts well with peers and staff. Pt is taking medication.Pt receptive to treatment and safety maintained on unit.

## 2011-11-16 DIAGNOSIS — K859 Acute pancreatitis without necrosis or infection, unspecified: Secondary | ICD-10-CM | POA: Diagnosis present

## 2011-11-16 MED ORDER — CALCIUM CARBONATE ANTACID 500 MG PO CHEW
200.0000 mg | CHEWABLE_TABLET | Freq: Three times a day (TID) | ORAL | Status: DC
  Administered 2011-11-17 – 2011-11-18 (×4): 200 mg via ORAL
  Filled 2011-11-16 (×10): qty 1

## 2011-11-16 MED ORDER — TUBERCULIN PPD 5 UNIT/0.1ML ID SOLN
5.0000 [IU] | Freq: Once | INTRADERMAL | Status: AC
  Administered 2011-11-16: 5 [IU] via INTRADERMAL

## 2011-11-16 MED ORDER — NONFORMULARY OR COMPOUNDED ITEM
1.0000 | Freq: Once | Status: DC
  Filled 2011-11-16: qty 1

## 2011-11-16 NOTE — Progress Notes (Signed)
[facility] MD Progress Note  11/16/2011 4:25 PM  The patient was seen today and reports the following:  Diagnosis:   Axis I: Alcohol Abuse and Major Depression, Recurrent severe Axis II: Deferred Axis III:  Past Medical History  Diagnosis Date  . Alcohol addiction   . Barrett esophagus   . Acid reflux   . Depression    Axis IV: economic problems, occupational problems, other psychosocial or environmental problems and problems related to social environment Axis V: 61-70 mild symptoms  ADL's:  Intact  Sleep:  Yes,  AEB:  Appetite:  Yes,  AEB:  Mild>(1-10) >Severe  Hopelessness (1-10):5 Depression (1-10): 5 Anxiety (1-10): 8  Suicidal Ideation:   Plan:  No  Intent:  No  Means:  No  Homicidal Ideation:   Plan:  No  Intent:  No  Means:  No  Mental Status: General Appearance /Behavior:  Neat and Casual Eye Contact:  Good Motor Behavior:  Normal Speech:  Normal Level of Consciousness:  Alert Mood:  Anxious and Depressed Affect:  Appropriate Anxiety Level:  Moderate Thought Process:  Coherent, Relevant and future oriented Thought Content:  WNL Perception:  Normal Judgment:  Fair Insight:  Present Cognition:  Orientation time, place and person Sleep:  Number of Hours: 5.75   Vital Signs:Blood pressure 112/71, pulse 97, temperature 98 F (36.7 C), temperature source Oral, resp. rate 16, height 5\' 3"  (1.6 m), weight 52.617 kg (116 lb).  Lab Results: No results found for this or any previous visit (from the past 48 hour(s)).  Physical Findings: AIMS:  , ,  ,  ,    CIWA:  CIWA-Ar Total: 6  COWS:     Treatment Plan Summary: Daily contact with patient to assess and evaluate symptoms and progress in treatment Medication management The patient will deny suicidal ideations for 48 hours prior to discharge and have a depression and anxiety rating of 3 or less.  Plan: Pt plans a family meeting tonight to decide on length of stay in rehab.  She is concerned about  relationship with son, Melody Alexander.  Jairon Ripberger 11/16/2011, 4:25 PM

## 2011-11-16 NOTE — Progress Notes (Signed)
D:  Patient admitted for alcohol detox.  She denies SI/HI/AVH at this time.  She complained of some abdominal pain this morning that was relieved by toradol.  This afternoon her pain worsened and she complained of nausea, as well.  Tylenol was given with minimal pain relief.  Her pain did get significantly better with Toradol and her nausea was relieved with Zofran.  She is participating in groups and is interacting appropriately with other patients and with staff.  PPD placed on right arm.   A:  Administered pain and nausea medication as needed.  Continue to monitor for signs and symptoms of withdrawals and provided emotional support. R:  Patient stated that pain was "much better" after toradol this afternoon.  Safety maintained on unit.

## 2011-11-16 NOTE — Progress Notes (Signed)
[facility] Group Notes:  (Counselor/Nursing/MHT/Case Management/Adjunct)  11/16/2011   Type of Therapy:  Group Therapy  Participation Level:  Active  Participation Quality:  Appropriate  Affect:  Anxious  Cognitive:  Alert and Oriented  Insight:  Limited  Engagement in Group:  Good  Engagement in Therapy:  Good  Modes of Intervention:  Clarification, Problem-solving, Socialization and Support  Summary of Progress/Problems:  Melody Alexander was in and out of group several times today yet was involved in discussion; patient shared that "My day can turn on a dime based on my emotions.  They are the cause of my using most of the time. Sometimes something makes me so upset I user that as an excuse for using. Oh my God, what did I just say?!" Others empathized w patient and much laughter was shared. Melody Alexander shared frustration with minimal choices for long term rehab; the best place looks like it will not allow me the contact I want with my son."  Other patiens offered support and one patient shared "this may be your only chance."   Melody Alexander 11/16/2011, 4:28 PM

## 2011-11-16 NOTE — Progress Notes (Signed)
[facility] Group Notes:  (Counselor/Nursing/MHT/Case Management/Adjunct)  11/16/2011 4:29 PM  Type of Therapy:  Group Therapy at 1:15  Participation Level:  Minimal  Participation Quality:  Inattentive  Affect:  Anxious  Cognitive:  Appropriate  Insight:  Limited  Engagement in Group:  Limited  Engagement in Therapy:  Limited  Modes of Intervention:  Clarification, Socialization and Support  Summary of Progress/Problems:  Jennier was late to group and left at two separate points and became somewhat tearful when friend was discharged.  Not engaged in group process this afternoon.    Clide Dales 11/16/2011, 4:29 PM

## 2011-11-16 NOTE — Progress Notes (Signed)
Pt is resting in bed with eyes closed  And no distress noted   Respirations are normal and even  Will continue Q 15 min checks  Pt safe at present

## 2011-11-16 NOTE — Progress Notes (Signed)
Psychoeducational Group Note  Date:  11/16/2011 Time:  2130  Group Topic/Focus:  Wrap-Up Group:   The focus of this group is to help patients review their daily goal of treatment and discuss progress on daily workbooks.  Participation Level:  Active  Participation Quality:  Appropriate and Attentive  Affect:  Appropriate  Cognitive:  Appropriate  Insight:  Good  Engagement in Group:  Good  Additional Comments:  Pt attended NA group this evening.  Pt participated in serenity prayer and read some of NA group readings.  Aundria Rud, Chayne Baumgart L 11/16/2011, 11:38 PM

## 2011-11-16 NOTE — Progress Notes (Signed)
Patient ID: Melody Alexander, female   DOB: 02/16/1976, 36 y.o.   MRN: [id number] D: Pt. Tearful, "I'm having mixed emotions right now." Pt. Anxious about decision to hopefully be admitted to a two year program in [facility]. Pt. Reports she was able to visit with family today and hates to leave son for such a long period. A: Writer administered med for anxiety (see MAR) and offered support and encouragement. R: Staff will continue to monitor q15 min. For safety.

## 2011-11-17 MED ORDER — SUCRALFATE 1 G PO TABS
1.0000 g | ORAL_TABLET | Freq: Three times a day (TID) | ORAL | Status: DC
  Administered 2011-11-17 – 2011-11-18 (×6): 1 g via ORAL
  Filled 2011-11-17 (×13): qty 1

## 2011-11-17 MED ORDER — ENSURE COMPLETE PO LIQD
237.0000 mL | Freq: Three times a day (TID) | ORAL | Status: DC
  Administered 2011-11-17 – 2011-11-18 (×2): 237 mL via ORAL

## 2011-11-17 NOTE — Progress Notes (Signed)
D: Pt denies SI/HI/AVH. Pt apprehensive about receiving treatment rehab program because she will be away from her son and dog for so long. Pt states that she is still experiencing some hopelessness and depression but it seems to be improving. Pt interaction is aggressive and pt is needy. A: Support and encouragement offered. Advised pt that although she is concerned about her son and dog, which is understandable, she really needs to focus on her treatment so that she can get better for them. R: Pt receptive.

## 2011-11-17 NOTE — Progress Notes (Signed)
[facility] Group Notes:  (Counselor/Nursing/MHT/Case Management/Adjunct)  11/17/2011 3:58 PM  Type of Therapy:  Group Therapy at 11  Participation Level:  Active  Participation Quality:  Attentive and Sharing  Affect:  Appropriate  Cognitive:  Appropriate  Insight:  Improved  Engagement in Group:  Good  Engagement in Therapy:  Good  Modes of Intervention:  Clarification, Socialization and Support  Summary of Progress/Problems: Focus of group processing discussion was on balance in life; the components in life which have a negative influence on balance and the components which make for a more balanced life.  Patient shared that shame and guilt she often feels and judgement of family members are difficult deal with. Onedia identified positive components of her life would be supportive people which she does not feel she has.    [facility] Group Notes:  (Counselor/Nursing/MHT/Case Management/Adjunct)  11/17/2011 3:13 PM  Type of Therapy:  Group Therapy at 1:15  Participation Level:  Active  Participation Quality:  Attentive and Sharing  Affect:  Appropriate  Cognitive:  Appropriate  Insight:  Good  Engagement in Group:  Good  Engagement in Therapy:  Good  Modes of Intervention:  Clarification, Socialization and Support  Summary of Progress/Problems:  Pt participated in activity in which patients choose photographs to represent what their life would look and feel like were it in balance and another for out of balance. She chose photo of a padlocked door to represent out of balance and a sunset to represent balance. Pt expressed excitement about reward of going to beach from treatment center once she has 3 months clean and how much she was looking forward to going as she loves beach and has not gone in 5 or 6 years.   Clide Dales 11/17/2011, 3:58 PM

## 2011-11-17 NOTE — Progress Notes (Signed)
Patient ID: Melody Alexander, female   DOB: [birth year], 36 y.o.   MRN: [id number] Melody Alexander  35 y.o.  [id number] 1975/08/10  11/17/2011   Diagnosis:   Alcoholism                      Esophagitis                      Pancreatitis  Subjective: Melody Alexander is up and active in the unit milieu today.  She is anxious about all the paperwork that goes with applying to [facility] in [facility]. Melody Alexander states she feels much better today and that her pain has resolved about 30%.  Her tremors are better and she voices no symptoms of withdrawal.    Vital Signs:Blood pressure 101/70, pulse 103, temperature 98.3 F (36.8 C), temperature source Oral, resp. rate 16, height 5\' 3"  (1.6 m), weight 53.524 kg (118 lb).    Objective: Melody Alexander is neatly dressed, appropriately groomed.  Her affect is bright and her mood is positive.  She is alert and oriented x 3, she denies SI/HI, reports no AH/VH. She is in full contact with reality.   Assessment: Alcoholism                        Acute pancreatitis  Medications Scheduled:  . acamprosate  666 mg Oral TID WC  . calcium carbonate  200 mg of elemental calcium Oral TID WC  . carbamazepine  200 mg Oral BID  . citalopram  20 mg Oral Daily  . esomeprazole  40 mg Oral BH-q7a  . feeding supplement  237 mL Oral TID WC  . gabapentin  400 mg Oral TID  . multivitamin with minerals  1 tablet Oral Daily  . nicotine  14 mg Transdermal Daily  . NONFORMULARY OR COMPOUNDED ITEM 1 each  1 each Topical Once  . potassium chloride  20 mEq Oral BID  . sucralfate  1 g Oral TID AC & HS  . thiamine  100 mg Oral Daily  . vitamin B-12  500 mcg Oral Daily  . DISCONTD: lactose free nutrition  237 mL Oral TID WC  . DISCONTD: sucralfate  1 g Oral TID WC & HS     PRN Meds acetaminophen, alum & mag hydroxide-simeth, hydrOXYzine, ketorolac, magnesium hydroxide, ondansetron, traZODone  Plan: Continue current plan of care with no changes at this time. Will order liver  panel to ck liver functions. Re check amylase and lipase.  Rona Ravens. Olar Santini [facility] 11/17/2011 5:28 PM

## 2011-11-17 NOTE — Progress Notes (Signed)
D:  Patient is rating depression at 4/10 and hopelessness at 6/10.  She denies SI/HI and contracts for safety.  She does complain of some anxiety about going to a treatment program in [facility].  States that she is concerned about not being able to communicate with her son enough.  Melody Alexander complained of abdominal pain this morning. A:  Vistaril administered for anxiety and toradol for pain.  q15 minute checks for safety. R:  Patient's anxiety and pain relieved by medication.  She is participating in groups and interacting appropriately with other patients and staff.

## 2011-11-18 LAB — HEPATIC FUNCTION PANEL
ALT: 14 U/L (ref 0–35)
AST: 23 U/L (ref 0–37)
Bilirubin, Direct: <0.1 mg/dL (ref 0.0–0.3)
Total Bilirubin: 0.2 mg/dL — ABNORMAL LOW (ref 0.3–1.2)

## 2011-11-18 LAB — AMYLASE: Amylase: 239 U/L — ABNORMAL HIGH (ref 0–105)

## 2011-11-18 MED ORDER — ESOMEPRAZOLE MAGNESIUM 40 MG PO CPDR: 40.0000 mg | DELAYED_RELEASE_CAPSULE | Freq: Every day | ORAL | Status: DC

## 2011-11-18 MED ORDER — POTASSIUM CHLORIDE 10 MEQ PO TBCR: 20.0000 meq | EXTENDED_RELEASE_TABLET | Freq: Two times a day (BID) | ORAL | Status: DC

## 2011-11-18 MED ORDER — GABAPENTIN 400 MG PO CAPS: 400.0000 mg | ORAL_CAPSULE | Freq: Three times a day (TID) | ORAL | Status: DC

## 2011-11-18 MED ORDER — CARBAMAZEPINE ER 200 MG PO TB12: 200.0000 mg | ORAL_TABLET | Freq: Two times a day (BID) | ORAL | Status: DC

## 2011-11-18 MED ORDER — SUCRALFATE 1 G PO TABS: 1.0000 g | ORAL_TABLET | Freq: Three times a day (TID) | ORAL | Status: DC

## 2011-11-18 MED ORDER — VITAMIN B-12 500 MCG PO TABS: 500.0000 ug | ORAL_TABLET | Freq: Every day | ORAL | Status: DC

## 2011-11-18 MED ORDER — CITALOPRAM HYDROBROMIDE 20 MG PO TABS: 20.0000 mg | ORAL_TABLET | Freq: Every day | ORAL | Status: DC
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
## 2011-11-18 NOTE — Progress Notes (Signed)
Melody Alexander is tearful...sharing feelings of worry and ambivalence related to her impending DC today. She is worried and anxious...states  She has " all kinds of feelings" but that she ' knows she's doing the right thing". She completed her self inventory sheet and on it she wrote she denied SI, she rated her feelings of depression and hopelessness " 2 / 3 " and stated her DC plan was " same as before". ] A She is attending her groups as planned. States she is getting DC'Melody today and will be entering a " 2 year rehab program" that  I have known I needed for a long time now...."  R Safety is maintained and POC cont with DC plans being solidified. PD RN [facility]

## 2011-11-18 NOTE — Progress Notes (Signed)
Nursing DC Pt  Is given cc AVS, states she understands these instructions, will keep F/u appt, denies SI, will take her meds as ordered and that she's  " ready to be given a new chance". She is obviously nervous and anxious. HEr hands tremble as she signs paperwork. All belongings from her locker are returned to her and suicide risk assessment is completed as well. Pt is escorted to bldg  Entrance and father  Present to transport her to rehab center. PD RN [facility]
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
## 2011-11-18 NOTE — Discharge Summary (Signed)
Please see SRA by this writer for complete discharge diagnoses.

## 2011-11-18 NOTE — Tx Team (Signed)
Interdisciplinary Treatment Plan Update (Adult)  Date:  11/18/2011  Time Reviewed:  10:14 AM   Progress in Treatment: Attending groups:   Yes   Participating in groups:  Yes Taking medication as prescribed:  Yes Tolerating medication:  Yes Family/Significant othe contact made:  Patient understands diagnosis:  Yes Discussing patient identified problems/goals with staff: Yes Medical problems stabilized or resolved: Yes Denies suicidal/homicidal ideation:Yes Issues/concerns per patient self-inventory:  Other:  New problem(s) identified:  Reason for Continuation of Hospitalization:  Interventions implemented related to continuation of hospitalization:  Additional comments:  Estimated length of stay: Discharge home today  Discharge Plan:  Discharge with parents while awaiting admission to [facility]  New goal(s):  Review of initial/current patient goals per problem list:    1.  Goal(s): Detox from ETOH   Met:  Yes  Target date: d/c  As evidenced by:  Patient detoxed from ETOH  2.  Goal (s):   Eliminate SI/other thoughts of self harm   Met:  Yes  Target date: d/c  As evidenced by: Patient no longers endorse SI/other thought self harm    3.  Goal(s): .stabilize on meds   Met:  Yes  Target date: d/c  As evidenced by: Patient reports being stable on medications - symptoms have decreased    4.  Goal(s): Refer for residential treatment  Met:  Yes  Target date: d/c  As evidenced by:  Patient accepted for admission to [facility] next week  Attendees: Patient:  Melody Alexander 11/18/2011 10:41 AM  Nursing:     Physician:  Enis Gash, MD 11/18/2011 10:14 AM   Nursing:   Alease Frame, RN 11/18/2011 10:14 AM   CaseManager:  Juline Patch, LCSW 11/18/2011 10:14 AM   Counselor:  Ronda Fairly, LCSW-A 11/18/2011 10:14 AM   Other:  Reyes Ivan, LCSWA

## 2011-11-18 NOTE — Progress Notes (Signed)
D:Patient in bed resting with eyes closed.  Respirations even and unlabored. To apparent distress noted A: Staff to monitor Q 15 min for safety R: Patient remains safe on the unit.

## 2011-11-18 NOTE — Progress Notes (Signed)
[facility] Case Management Discharge Plan:  Will you be returning to the same living situation after discharge: No.  Patient to discharge to parent's home At discharge, do you have transportation home?:Yes,  Patient will arrange for parents to transport her home Do you have the ability to pay for your medications:Yes,  Patient can afford meds  Interagency Information:     Release of information consent forms completed and in the chart;  Patient's signature needed at discharge.  Patient to Follow up at:  [facility]  -  Week of November 21, 2011     [place], [place]     [phone]  Follow-up Information    Please follow up. (APPOINTMENT NEEDED - PLEASE DO NOT DISCHARGE WITHOUT THIS)          Patient denies SI/HI:   Yes,  Patient denies SI/HI    Safety Planning and Suicide Prevention discussed:  Yes,  Reviewed with all patient during aftercare group  Barrier to discharge identified:No.  Summary and Recommendations: Patient encourage to be compliant with medications and follow up with outpatient recommedations    April Carlyon, [place] July 11/18/2011, 10:25 AM

## 2011-11-23 NOTE — Progress Notes (Signed)
Patient Discharge Instructions:  After Visit Summary (AVS):   Faxed to:  11/21/2011 Psychiatric Admission Assessment Note:   Faxed to:  11/21/2011 Suicide Risk Assessment - Discharge Assessment:   Faxed to:  11/21/2011 Faxed/Sent to the Next Level Care provider:  11/21/2011  Faxed to [facility] @ [phone]  Wandra Scot, 11/23/2011, 9:17 AM

## 2017-12-16 ENCOUNTER — Encounter (HOSPITAL_COMMUNITY): Payer: Self-pay

## 2017-12-16 ENCOUNTER — Ambulatory Visit (HOSPITAL_COMMUNITY)
Admission: EM | Admit: 2017-12-16 | Discharge: 2017-12-16 | Disposition: A | Discharge status: Home / Self Care | Payer: Medicaid Other | Attending: Internal Medicine | Admitting: Internal Medicine

## 2017-12-16 DIAGNOSIS — J019 Acute sinusitis, unspecified: Secondary | ICD-10-CM

## 2017-12-16 LAB — POCT I-STAT, CHEM 8
BUN: 22 mg/dL — AB (ref 6–20)
CREATININE: 0.6 mg/dL (ref 0.44–1.00)
Calcium, Ion: 1.25 mmol/L (ref 1.15–1.40)
Chloride: 99 mmol/L (ref 98–111)
GLUCOSE: 75 mg/dL (ref 70–99)
HEMATOCRIT: 36 % (ref 36.0–46.0)
HEMOGLOBIN: 12.2 g/dL (ref 12.0–15.0)
Potassium: 4 mmol/L (ref 3.5–5.1)
Sodium: 137 mmol/L (ref 135–145)
TCO2: 28 mmol/L (ref 22–32)

## 2017-12-16 MED ORDER — MUPIROCIN 2 % EX OINT: 1.0000 "application " | TOPICAL_OINTMENT | Freq: Two times a day (BID) | CUTANEOUS | 0 refills | Status: DC

## 2017-12-16 MED ORDER — AMOXICILLIN-POT CLAVULANATE 875-125 MG PO TABS: 1.0000 | ORAL_TABLET | Freq: Two times a day (BID) | ORAL | 0 refills | Status: DC

## 2017-12-16 NOTE — Discharge Instructions (Signed)
Please begin Augmentin twice daily for the next 10 days Please apply Bactroban to lesion inside the nose twice daily  May take a daily Zyrtec or Claritin to help with congestion, may care with Mucinex  Please follow-up if symptoms not improving or worsening

## 2017-12-16 NOTE — ED Triage Notes (Signed)
Pt presents with stuffy nose and facial pain. Reports nose has been swollen and red.

## 2017-12-16 NOTE — ED Provider Notes (Signed)
[facility]    CSN: [id number] Arrival date & time: 12/16/17  1749     History   Chief Complaint Chief Complaint  Patient presents with  . Facial Pain    HPI Melody Alexander is a 42 y.o. female history of alcoholism presenting today for evaluation of nasal congestion, sinus pressure as well as redness and swelling to the tip of her nose.  Patient states that she has had congestion and pressure for the past month.  She denies any fevers.  She notes that she recently scratched the inside of her nose and since she has developed swelling and redness in this area.  She notes that she has a disease that causes her kidneys to "malfunction", states that occasionally her electrolytes will be too high or too low.  She states that her kidneys are not failing and does not have CKD.  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   HPI  Past Medical History:  Diagnosis Date  . Acid reflux   . Alcohol addiction (HCC)   . Barrett esophagus   . Depression     Patient Active Problem List   Diagnosis Date Noted  . Pancreatitis 11/16/2011    Class: Acute  . Alcoholism (HCC) 11/11/2011  . Esophagitis 11/11/2011    Past Surgical History:  Procedure Laterality Date  . CHOLECYSTECTOMY    . NOSE SURGERY    . TONSILLECTOMY      OB History   None      Home Medications    Prior to Admission medications   Medication Sig Start Date End Date Taking? Authorizing Provider  amoxicillin-clavulanate (AUGMENTIN) 875-125 MG tablet Take 1 tablet by mouth every 12 (twelve) hours. 12/16/17   Doria Fern C, PA-C  carbamazepine (TEGRETOL XR) 200 MG 12 hr tablet Take 1 tablet (200 mg total) by mouth 2 (two) times daily. For mood stabilization 11/18/11 11/17/12  Armandina Stammer I, NP  citalopram (CELEXA) 20 MG tablet Take 1 tablet (20 mg total) by mouth daily. For depression 11/18/11 11/17/12  Armandina Stammer I, NP  esomeprazole (NEXIUM) 40 MG capsule Take 1 capsule (40 mg total) by mouth daily  before breakfast. For acid reflux 11/18/11   Armandina Stammer I, NP  gabapentin (NEURONTIN) 400 MG capsule Take 1 capsule (400 mg total) by mouth 3 (three) times daily. For anxiety 11/18/11 11/17/12  Armandina Stammer I, NP  mupirocin ointment (BACTROBAN) 2 % Place 1 application into the nose 2 (two) times daily. 12/16/17   Aycen Porreca C, PA-C  potassium chloride (KLOR-CON) 10 MEQ CR tablet Take 2 tablets (20 mEq total) by mouth 2 (two) times daily. For Potassium supplement 11/18/11   Armandina Stammer I, NP  sucralfate (CARAFATE) 1 G tablet Take 1 tablet (1 g total) by mouth 4 (four) times daily -  before meals and at bedtime. For acid reflux/ulcers 11/18/11 11/17/12  Armandina Stammer I, NP  vitamin B-12 (CYANOCOBALAMIN) 500 MCG tablet Take 1 tablet (500 mcg total) by mouth daily. For B12 supplement 11/18/11   Sanjuana Kava, NP    Family History Family History  Problem Relation Age of Onset  . Healthy Mother   . Heart disease Father     Social History Social History   Tobacco Use  . Smoking status: Current Every Day Smoker    Packs/day: 0.50  . Smokeless tobacco: Never Used  Substance Use Topics  . Alcohol use: Yes    Comment: last drank on Monday - drinks a pt   .  Drug use: Yes    Types: Marijuana     Allergies   Dilaudid [hydromorphone hcl] and Norco [hydrocodone-acetaminophen]   Review of Systems Review of Systems  Constitutional: Negative for activity change, appetite change, chills, fatigue and fever.  HENT: Positive for congestion, facial swelling, rhinorrhea and sinus pressure. Negative for ear pain, sore throat and trouble swallowing.   Eyes: Negative for discharge and redness.  Respiratory: Negative for cough, chest tightness and shortness of breath.   Cardiovascular: Negative for chest pain.  Gastrointestinal: Negative for abdominal pain, diarrhea, nausea and vomiting.  Musculoskeletal: Negative for myalgias.  Skin: Negative for rash.  Neurological: Negative for dizziness,  light-headedness and headaches.     Physical Exam Triage Vital Signs ED Triage Vitals  Enc Vitals Group     BP 12/16/17 1841 115/86     Pulse Rate 12/16/17 1841 96     Resp 12/16/17 1841 18     Temp 12/16/17 1841 98 F (36.7 C)     Temp src --      SpO2 12/16/17 1841 100 %     Weight --      Height --      Head Circumference --      Peak Flow --      Pain Score 12/16/17 1842 3     Pain Loc --      Pain Edu? --      Excl. in GC? --    No data found.  Updated Vital Signs BP 115/86   Pulse 96   Temp 98 F (36.7 C)   Resp 18   SpO2 100%   Visual Acuity Right Eye Distance:   Left Eye Distance:   Bilateral Distance:    Right Eye Near:   Left Eye Near:    Bilateral Near:     Physical Exam  Constitutional: She appears well-developed and well-nourished. No distress.  HENT:  Head: Normocephalic and atraumatic.  Bilateral ears without tenderness to palpation of external auricle, tragus and mastoid, EAC's without erythema or swelling, TM's with good bony landmarks and cone of light. Non erythematous.  Tip of nose appears slightly erythematous and swollen, papular erythematous lesion inside right nares  Oral mucosa pink and moist, no tonsillar enlargement or exudate. Posterior pharynx patent and nonerythematous, no uvula deviation or swelling. Normal phonation.   Eyes: Conjunctivae are normal.  Neck: Neck supple.  Cardiovascular: Normal rate and regular rhythm.  No murmur heard. Pulmonary/Chest: Effort normal and breath sounds normal. No respiratory distress.  Breathing comfortably at rest, CTABL, no wheezing, rales or other adventitious sounds auscultated  Abdominal: Soft. There is no tenderness.  Musculoskeletal: She exhibits no edema.  Neurological: She is alert.  Skin: Skin is warm and dry.  Psychiatric: She has a normal mood and affect.  Nursing note and vitals reviewed.    UC Treatments / Results  Labs (all labs ordered are listed, but only abnormal  results are displayed) Labs Reviewed  POCT I-STAT, CHEM 8 - Abnormal; Notable for the following components:      Result Value   BUN 22 (*)    All other components within normal limits    EKG None  Radiology No results found.  Procedures Procedures (including critical care time)  Medications Ordered in UC Medications - No data to display  Initial Impression / Assessment and Plan / UC Course  I have reviewed the triage vital signs and the nursing notes.  Pertinent labs & imaging results that were available  during my care of the patient were reviewed by me and considered in my medical decision making (see chart for details).     We will treat patient for acute sinusitis given length of symptoms, will also provide Bactroban ointment to apply inside nasal mucosa for nasal abrasion.  I-STAT checked given patient's history of kidney disorder, BUN slightly elevated, creatinine 0.6.  Sodium, chloride and potassium all within normal limits.  We will continue with management of URI symptoms.  Discussed further symptomatic management first congestion.Discussed strict return precautions. Patient verbalized understanding and is agreeable with plan.  Final Clinical Impressions(s) / UC Diagnoses   Final diagnoses:  Acute sinusitis with symptoms > 10 days     Discharge Instructions     Please begin Augmentin twice daily for the next 10 days Please apply Bactroban to lesion inside the nose twice daily  May take a daily Zyrtec or Claritin to help with congestion, may care with Mucinex  Please follow-up if symptoms not improving or worsening    ED Prescriptions    Medication Sig Dispense Auth. Provider   mupirocin ointment (BACTROBAN) 2 % Place 1 application into the nose 2 (two) times daily. 22 g Stephaie Dardis C, PA-C   amoxicillin-clavulanate (AUGMENTIN) 875-125 MG tablet Take 1 tablet by mouth every 12 (twelve) hours. 14 tablet Lendell Gallick, [place] C, PA-C     Controlled Substance  Prescriptions [place] Controlled Substance Registry consulted? Not Applicable   Lew Dawes, [place] 12/16/17 1950

## 2017-12-20 ENCOUNTER — Emergency Department (HOSPITAL_COMMUNITY)
Admission: EM | Admit: 2017-12-20 | Discharge: 2017-12-20 | Discharge status: Against Medical Advice | Payer: Medicaid Other | Attending: Emergency Medicine | Admitting: Emergency Medicine

## 2017-12-20 ENCOUNTER — Encounter (HOSPITAL_COMMUNITY): Payer: Self-pay | Admitting: Emergency Medicine

## 2017-12-20 ENCOUNTER — Other Ambulatory Visit: Payer: Self-pay

## 2017-12-20 DIAGNOSIS — R51 Headache: Secondary | ICD-10-CM | POA: Diagnosis present

## 2017-12-20 DIAGNOSIS — Z5321 Procedure and treatment not carried out due to patient leaving prior to being seen by health care provider: Secondary | ICD-10-CM | POA: Insufficient documentation

## 2017-12-20 NOTE — ED Triage Notes (Signed)
Pt reports she thinks she is having a MRSA outbreak on the inside of her rt nostril with visible wound noted with some bloody yellow drainage. Pt nose is swollen and red. Pt went to doctors office 5 days ago and has been on antibiotics 4 days without any relief. Pt reports hx of mrsa. Pt reports 7/10 pain on nose also headache. Pt also reports lower back pain over the last year intermittently that "snaps" when moving. Denies injury, denies any neuro and urinary sx.

## 2017-12-20 NOTE — ED Notes (Signed)
No answer for room x4 °

## 2017-12-22 NOTE — ED Notes (Signed)
Follow up call made  No answer  12/22/17  1109  s Braelen Sproule rn

## 2018-02-01 ENCOUNTER — Encounter (HOSPITAL_COMMUNITY): Payer: Self-pay | Admitting: *Deleted

## 2018-02-01 ENCOUNTER — Emergency Department (HOSPITAL_COMMUNITY)
Admission: EM | Admit: 2018-02-01 | Discharge: 2018-02-01 | Discharge status: Against Medical Advice | Payer: Medicaid Other | Attending: Emergency Medicine | Admitting: Emergency Medicine

## 2018-02-01 ENCOUNTER — Other Ambulatory Visit: Payer: Self-pay

## 2018-02-01 ENCOUNTER — Encounter (HOSPITAL_COMMUNITY): Payer: Self-pay | Admitting: Emergency Medicine

## 2018-02-01 DIAGNOSIS — R531 Weakness: Secondary | ICD-10-CM | POA: Diagnosis present

## 2018-02-01 DIAGNOSIS — Z5321 Procedure and treatment not carried out due to patient leaving prior to being seen by health care provider: Secondary | ICD-10-CM | POA: Diagnosis not present

## 2018-02-01 LAB — COMPREHENSIVE METABOLIC PANEL
ALBUMIN: 3.9 g/dL (ref 3.5–5.0)
ALT: 13 U/L (ref 0–44)
ANION GAP: 11 (ref 5–15)
AST: 23 U/L (ref 15–41)
Alkaline Phosphatase: 61 U/L (ref 38–126)
BUN: 23 mg/dL — ABNORMAL HIGH (ref 6–20)
CALCIUM: 9.7 mg/dL (ref 8.9–10.3)
CO2: 26 mmol/L (ref 22–32)
Chloride: 105 mmol/L (ref 98–111)
Creatinine, Ser: 0.7 mg/dL (ref 0.44–1.00)
GFR calc non Af Amer: >60 mL/min (ref >60)
GLUCOSE: 95 mg/dL (ref 70–99)
Potassium: 3 mmol/L — ABNORMAL LOW (ref 3.5–5.1)
SODIUM: 142 mmol/L (ref 135–145)
Total Bilirubin: 0.5 mg/dL (ref 0.3–1.2)
Total Protein: 7.8 g/dL (ref 6.5–8.1)

## 2018-02-01 LAB — CBC
HCT: 38.5 % (ref 36.0–46.0)
Hemoglobin: 11.7 g/dL — ABNORMAL LOW (ref 12.0–15.0)
MCH: 22.5 pg — ABNORMAL LOW (ref 26.0–34.0)
MCHC: 30.4 g/dL (ref 30.0–36.0)
MCV: 74 fL — ABNORMAL LOW (ref 78.0–100.0)
Platelets: 463 10*3/uL — ABNORMAL HIGH (ref 150–400)
RBC: 5.2 MIL/uL — AB (ref 3.87–5.11)
RDW: 16.4 % — ABNORMAL HIGH (ref 11.5–15.5)
WBC: 7.9 10*3/uL (ref 4.0–10.5)

## 2018-02-01 LAB — I-STAT BETA HCG BLOOD, ED (MC, WL, AP ONLY)

## 2018-02-01 LAB — LIPASE, BLOOD: LIPASE: 54 U/L — AB (ref 11–51)

## 2018-02-01 NOTE — ED Triage Notes (Signed)
Pt to ed by [facility] with c/o of abdominal pain. Pt was just here and left AMA.

## 2018-02-01 NOTE — ED Notes (Signed)
Pt stated she doesn't want new vitals right now.

## 2018-02-01 NOTE — ED Notes (Addendum)
Pt having a breakdown in lobby. Pt has been hysterical for the last 30 minutes crying and yelling and talking about her history in cubby with EMT first. EMT has been trying to console Pt, Pt inconsolable and agitated if you try and talk to her. Pt charging phone in cubby. Pt stating that someone told her her ride was here, per RN note she said her ride was here. Pt demanding to speak to the person up front. Not sure who Pt is referring to as Pt came in EMS. Pt starting to become hostile. Pt calmed down and then Pt was asked to step out of cubby to charge her phone but she could sit close and EMT would keep an eye on her as she was worried. When Pt stepped out Pt became completely irate stating she needed to call the [employer]. Pt demanding to see security cameras as she didn't feel safe and demanding security. Pt angry not at EMT and becoming verbally abusive and yelling at EMT. Pt asked not to speak to EMT and staff in that manor as it is inapropriate. Pt called EMT a "bitch" and that when she asks someone to do something they need to do it now. EMT at this point walked away. Pt screaming and cussing at EMT. Other staff members arrived.

## 2018-02-01 NOTE — ED Notes (Signed)
Pt directed to lobby. She says she wants to know her lab results before she goes to the lobby. I discussed with her that we cannot give results, the provider would review those with results when she gets in a treatment room. I discussed with her that we do monitor results in the lobby and take pts back accordingly. She says she may not be able to stay because her ride is here and it is the only ride she has.

## 2018-02-01 NOTE — ED Triage Notes (Addendum)
Per [facility] pt from home for abd pains, n/v and weakness that started today. Pt reports that she is out of her Barrett's esophagus medications and that is what is causing her to vomit.  IV 18g left forearm NS given and Zofran 4mg  in route.  Vitals: CBG 94, 125/74, 75HR, 99% on RA.

## 2018-02-01 NOTE — ED Triage Notes (Signed)
Pt ambulatory from EMS truck to lobby and then again to triage area for triage assessment. Pt then proceeded to lie on the floor stating she could not sit up or walk. Pt then got up and into w/c. She says her stomach hurts and she is scared about her potassium level. Pt updated plan of care at this time

## 2018-02-02 ENCOUNTER — Emergency Department (HOSPITAL_COMMUNITY)
Admission: EM | Admit: 2018-02-02 | Discharge: 2018-02-02 | Discharge status: Against Medical Advice | Payer: Medicaid Other | Source: Home / Self Care

## 2018-12-01 ENCOUNTER — Emergency Department (HOSPITAL_COMMUNITY)
Admission: EM | Admit: 2018-12-01 | Discharge: 2018-12-01 | Discharge status: Against Medical Advice | Payer: Medicaid Other | Source: Home / Self Care | Attending: Emergency Medicine | Admitting: Emergency Medicine

## 2018-12-01 ENCOUNTER — Other Ambulatory Visit: Payer: Self-pay

## 2018-12-01 ENCOUNTER — Encounter (HOSPITAL_COMMUNITY): Payer: Self-pay

## 2018-12-01 DIAGNOSIS — Z590 Homelessness: Secondary | ICD-10-CM | POA: Insufficient documentation

## 2018-12-01 DIAGNOSIS — F17210 Nicotine dependence, cigarettes, uncomplicated: Secondary | ICD-10-CM | POA: Insufficient documentation

## 2018-12-01 DIAGNOSIS — R531 Weakness: Secondary | ICD-10-CM

## 2018-12-01 DIAGNOSIS — Z885 Allergy status to narcotic agent status: Secondary | ICD-10-CM | POA: Insufficient documentation

## 2018-12-01 DIAGNOSIS — Z5321 Procedure and treatment not carried out due to patient leaving prior to being seen by health care provider: Secondary | ICD-10-CM | POA: Insufficient documentation

## 2018-12-01 NOTE — ED Provider Notes (Signed)
[facility] DEPT Provider Note   CSN: [id number] Arrival date & time: 12/01/18  0000    History   Chief Complaint Chief Complaint  Patient presents with  . Generalized Body Aches    HPI Melody Alexander is a 43 y.o. female.     HPI Patient presents with concern of generalized weakness, as well as concerns about her homelessness. Patient gives a long history of her current situation, but in essence, she has been transient, moving from different counties in the state, is currently homeless, having a difficult time obtaining resources. She denies focal pain, states that she is felt more weak than usual over the past few days, attributes this to heat exposure, homelessness. However, she has had previous episodes of hypokalemia during similar episodes of weakness, and has this as a specific concern. No syncope, ongoing constipation, no vomiting, no fever, no dyspnea. It is unclear if she is taking any medication as directed. Past Medical History:  Diagnosis Date  . Acid reflux   . Alcohol addiction ([place])   . Barrett esophagus   . Depression     Patient Active Problem List   Diagnosis Date Noted  . Pancreatitis 11/16/2011    Class: Acute  . Alcoholism ([place]) 11/11/2011  . Esophagitis 11/11/2011    Past Surgical History:  Procedure Laterality Date  . CHOLECYSTECTOMY    . NOSE SURGERY    . TONSILLECTOMY       OB History   No obstetric history on file.      Home Medications    Prior to Admission medications   Medication Sig Start Date End Date Taking? Authorizing Provider  amoxicillin-clavulanate (AUGMENTIN) 875-125 MG tablet Take 1 tablet by mouth every 12 (twelve) hours. 12/16/17   Wieters, Hallie C, PA-C  carbamazepine (TEGRETOL XR) 200 MG 12 hr tablet Take 1 tablet (200 mg total) by mouth 2 (two) times daily. For mood stabilization 11/18/11 11/17/12  Lindell Spar I, NP  citalopram (CELEXA) 20 MG tablet Take 1 tablet (20 mg total)  by mouth daily. For depression 11/18/11 11/17/12  Lindell Spar I, NP  esomeprazole (NEXIUM) 40 MG capsule Take 1 capsule (40 mg total) by mouth daily before breakfast. For acid reflux 11/18/11   Lindell Spar I, NP  gabapentin (NEURONTIN) 400 MG capsule Take 1 capsule (400 mg total) by mouth 3 (three) times daily. For anxiety 11/18/11 11/17/12  Lindell Spar I, NP  mupirocin ointment (BACTROBAN) 2 % Place 1 application into the nose 2 (two) times daily. 12/16/17   Wieters, Hallie C, PA-C  potassium chloride (KLOR-CON) 10 MEQ CR tablet Take 2 tablets (20 mEq total) by mouth 2 (two) times daily. For Potassium supplement 11/18/11   Lindell Spar I, NP  sucralfate (CARAFATE) 1 G tablet Take 1 tablet (1 g total) by mouth 4 (four) times daily -  before meals and at bedtime. For acid reflux/ulcers 11/18/11 11/17/12  Lindell Spar I, NP  vitamin B-12 (CYANOCOBALAMIN) 500 MCG tablet Take 1 tablet (500 mcg total) by mouth daily. For B12 supplement 11/18/11   Encarnacion Slates, NP    Family History Family History  Problem Relation Age of Onset  . Healthy Mother   . Heart disease Father     Social History Social History   Tobacco Use  . Smoking status: Current Every Day Smoker    Packs/day: 0.50  . Smokeless tobacco: Never Used  Substance Use Topics  . Alcohol use: Yes    Comment: last drank  on Monday - drinks a pt   . Drug use: Yes    Types: Marijuana   Homeless, has not successfully engaged with any of the local homeless facilities.  Allergies   Dilaudid [hydromorphone hcl] and Norco [hydrocodone-acetaminophen]   Review of Systems Review of Systems  Constitutional:       Per HPI, otherwise negative  HENT:       Per HPI, otherwise negative  Respiratory:       Per HPI, otherwise negative  Cardiovascular:       Per HPI, otherwise negative  Gastrointestinal: Positive for constipation. Negative for vomiting.  Endocrine:       Negative aside from HPI  Genitourinary:       Neg aside from HPI    Musculoskeletal:       Per HPI, otherwise negative  Skin: Negative.   Neurological: Positive for weakness. Negative for syncope.     Physical Exam Updated Vital Signs BP 114/82   Pulse 97   Temp 98.2 F (36.8 C) (Oral)   Resp 14   SpO2 97%   Physical Exam Vitals signs and nursing note reviewed.  Constitutional:      General: She is not in acute distress.    Appearance: She is well-developed.  HENT:     Head: Normocephalic and atraumatic.  Eyes:     Conjunctiva/sclera: Conjunctivae normal.  Cardiovascular:     Rate and Rhythm: Normal rate and regular rhythm.  Pulmonary:     Effort: Pulmonary effort is normal. No respiratory distress.     Breath sounds: Normal breath sounds. No stridor.  Abdominal:     General: There is no distension.  Skin:    General: Skin is warm and dry.  Neurological:     Mental Status: She is alert and oriented to person, place, and time.     Cranial Nerves: No cranial nerve deficit.  Psychiatric:     Comments: Some insight into her current presentation.  Speech is appropriate, she answers questions with deflection, perseverating on her social situation.      ED Treatments / Results  Labs (all labs ordered are listed, but only abnormal results are displayed) Labs Reviewed  I-STAT CHEM 8, ED    EKG None  Radiology No results found.  Procedures Procedures (including critical care time)  Medications Ordered in ED Medications - No data to display   Initial Impression / Assessment and Plan / ED Course  I have reviewed the triage vital signs and the nursing notes.  Pertinent labs & imaging results that were available during my care of the patient were reviewed by me and considered in my medical decision making (see chart for details).  Update: Patient eloped prior to repeat evaluation, prior to lab draw. This adult female was presenting with generalized weakness, but seemingly concerned about her homelessness. Patient was awake,  alert, hemodynamically unremarkable, with no focal weakness, there is low suspicion for acute new CNS pathology, some suspicion for mild dehydration, but no hemodynamic instability suggesting this was overwhelming. However, patient left prior to complete evaluation.  Final Clinical Impressions(s) / ED Diagnoses   Final diagnoses:  Weakness    ED Discharge Orders    None       Gerhard MunchLockwood, Tamiya Colello, MD 12/01/18 1142

## 2018-12-01 NOTE — ED Notes (Signed)
Pt called from the lobby with no response x2 

## 2018-12-01 NOTE — ED Notes (Signed)
[facility] called to stick patient for ISTAT chem8

## 2018-12-01 NOTE — ED Triage Notes (Signed)
Pt originally stating that she is here because she is homeless. States that when it get hot outside she has a kidney problem and it causes generalized weakness and she has hand pain.

## 2018-12-01 NOTE — ED Notes (Signed)
Pt stated she was going out to see a friend, she had cigarettes in hand, when writer explained she can not go out to smoke she stated she would just leave them and proceeded to walk. Dylan EMT present during conversation. Pts belongings placed in lobby for her to take with her.

## 2018-12-01 NOTE — ED Notes (Signed)
Attempted to draw labs 3 times, unsuccessful patient has very small veins that roll

## 2018-12-02 ENCOUNTER — Other Ambulatory Visit: Payer: Self-pay

## 2018-12-02 ENCOUNTER — Inpatient Hospital Stay (HOSPITAL_COMMUNITY)
Admission: EM | Admit: 2018-12-02 | Discharge: 2018-12-03 | DRG: 812 | Disposition: A | Discharge status: Home / Self Care | Payer: Medicaid Other | Attending: Internal Medicine | Admitting: Internal Medicine

## 2018-12-02 ENCOUNTER — Encounter (HOSPITAL_COMMUNITY): Payer: Self-pay

## 2018-12-02 DIAGNOSIS — K5900 Constipation, unspecified: Secondary | ICD-10-CM | POA: Diagnosis present

## 2018-12-02 DIAGNOSIS — K22719 Barrett's esophagus with dysplasia, unspecified: Secondary | ICD-10-CM | POA: Diagnosis not present

## 2018-12-02 DIAGNOSIS — F4321 Adjustment disorder with depressed mood: Secondary | ICD-10-CM | POA: Diagnosis present

## 2018-12-02 DIAGNOSIS — Z1159 Encounter for screening for other viral diseases: Secondary | ICD-10-CM

## 2018-12-02 DIAGNOSIS — F1020 Alcohol dependence, uncomplicated: Secondary | ICD-10-CM

## 2018-12-02 DIAGNOSIS — F329 Major depressive disorder, single episode, unspecified: Secondary | ICD-10-CM

## 2018-12-02 DIAGNOSIS — D649 Anemia, unspecified: Secondary | ICD-10-CM | POA: Diagnosis not present

## 2018-12-02 DIAGNOSIS — Z720 Tobacco use: Secondary | ICD-10-CM | POA: Diagnosis not present

## 2018-12-02 DIAGNOSIS — D500 Iron deficiency anemia secondary to blood loss (chronic): Principal | ICD-10-CM | POA: Diagnosis present

## 2018-12-02 DIAGNOSIS — Z915 Personal history of self-harm: Secondary | ICD-10-CM | POA: Diagnosis not present

## 2018-12-02 DIAGNOSIS — K2270 Barrett's esophagus without dysplasia: Secondary | ICD-10-CM | POA: Diagnosis present

## 2018-12-02 DIAGNOSIS — E876 Hypokalemia: Secondary | ICD-10-CM | POA: Diagnosis present

## 2018-12-02 DIAGNOSIS — K219 Gastro-esophageal reflux disease without esophagitis: Secondary | ICD-10-CM | POA: Diagnosis present

## 2018-12-02 DIAGNOSIS — Z5900 Homelessness unspecified: Secondary | ICD-10-CM

## 2018-12-02 DIAGNOSIS — D509 Iron deficiency anemia, unspecified: Secondary | ICD-10-CM | POA: Diagnosis present

## 2018-12-02 DIAGNOSIS — R45851 Suicidal ideations: Secondary | ICD-10-CM | POA: Diagnosis present

## 2018-12-02 DIAGNOSIS — F17210 Nicotine dependence, cigarettes, uncomplicated: Secondary | ICD-10-CM | POA: Diagnosis present

## 2018-12-02 DIAGNOSIS — Z8249 Family history of ischemic heart disease and other diseases of the circulatory system: Secondary | ICD-10-CM

## 2018-12-02 DIAGNOSIS — F32A Depression, unspecified: Secondary | ICD-10-CM | POA: Diagnosis present

## 2018-12-02 LAB — IRON AND TIBC
Iron: 11 ug/dL — ABNORMAL LOW (ref 28–170)
Saturation Ratios: 2 % — ABNORMAL LOW (ref 10.4–31.8)
TIBC: 603 ug/dL — ABNORMAL HIGH (ref 250–450)
UIBC: 592 ug/dL

## 2018-12-02 LAB — COMPREHENSIVE METABOLIC PANEL
ALT: 15 U/L (ref 0–44)
AST: 23 U/L (ref 15–41)
Albumin: 4 g/dL (ref 3.5–5.0)
Alkaline Phosphatase: 61 U/L (ref 38–126)
Anion gap: 10 (ref 5–15)
BUN: 13 mg/dL (ref 6–20)
CO2: 23 mmol/L (ref 22–32)
Calcium: 8.9 mg/dL (ref 8.9–10.3)
Chloride: 104 mmol/L (ref 98–111)
Creatinine, Ser: 0.51 mg/dL (ref 0.44–1.00)
GFR calc Af Amer: >60 mL/min (ref >60)
GFR calc non Af Amer: >60 mL/min (ref >60)
Glucose, Bld: 83 mg/dL (ref 70–99)
Potassium: 3.3 mmol/L — ABNORMAL LOW (ref 3.5–5.1)
Sodium: 137 mmol/L (ref 135–145)
Total Bilirubin: 0.1 mg/dL — ABNORMAL LOW (ref 0.3–1.2)
Total Protein: 7.7 g/dL (ref 6.5–8.1)

## 2018-12-02 LAB — RAPID URINE DRUG SCREEN, HOSP PERFORMED
Amphetamines: POSITIVE — AB
Barbiturates: NOT DETECTED
Benzodiazepines: NOT DETECTED
Cocaine: NOT DETECTED
Opiates: NOT DETECTED
Tetrahydrocannabinol: NOT DETECTED

## 2018-12-02 LAB — RETICULOCYTES
Immature Retic Fract: 28.8 % — ABNORMAL HIGH (ref 2.3–15.9)
RBC.: 4.9 MIL/uL (ref 3.87–5.11)
Retic Count, Absolute: 56.8 10*3/uL (ref 19.0–186.0)
Retic Ct Pct: 1.2 % (ref 0.4–3.1)

## 2018-12-02 LAB — CBC
HCT: 31.5 % — ABNORMAL LOW (ref 36.0–46.0)
Hemoglobin: 7.5 g/dL — ABNORMAL LOW (ref 12.0–15.0)
MCH: 15.7 pg — ABNORMAL LOW (ref 26.0–34.0)
MCHC: 23.8 g/dL — ABNORMAL LOW (ref 30.0–36.0)
MCV: 66 fL — ABNORMAL LOW (ref 80.0–100.0)
Platelets: 467 10*3/uL — ABNORMAL HIGH (ref 150–400)
RBC: 4.77 MIL/uL (ref 3.87–5.11)
RDW: 21.1 % — ABNORMAL HIGH (ref 11.5–15.5)
WBC: 6.1 10*3/uL (ref 4.0–10.5)
nRBC: 0 % (ref 0.0–0.2)

## 2018-12-02 LAB — FOLATE: Folate: 69.5 ng/mL (ref >5.9)

## 2018-12-02 LAB — POC OCCULT BLOOD, ED: Fecal Occult Bld: NEGATIVE

## 2018-12-02 LAB — VITAMIN B12: Vitamin B-12: 225 pg/mL (ref 180–914)

## 2018-12-02 LAB — SALICYLATE LEVEL: Salicylate Lvl: <7 mg/dL (ref 2.8–30.0)

## 2018-12-02 LAB — FERRITIN: Ferritin: 3 ng/mL — ABNORMAL LOW (ref 11–307)

## 2018-12-02 LAB — CARBAMAZEPINE LEVEL, TOTAL: Carbamazepine Lvl: <2 ug/mL — ABNORMAL LOW (ref 4.0–12.0)

## 2018-12-02 LAB — ABO/RH: ABO/RH(D): A POS

## 2018-12-02 LAB — MAGNESIUM: Magnesium: 2.4 mg/dL (ref 1.7–2.4)

## 2018-12-02 LAB — I-STAT BETA HCG BLOOD, ED (MC, WL, AP ONLY): I-stat hCG, quantitative: <5 m[IU]/mL (ref <5)

## 2018-12-02 LAB — PREPARE RBC (CROSSMATCH)

## 2018-12-02 LAB — GLUCOSE, CAPILLARY: Glucose-Capillary: 109 mg/dL — ABNORMAL HIGH (ref 70–99)

## 2018-12-02 LAB — SARS CORONAVIRUS 2 BY RT PCR (HOSPITAL ORDER, PERFORMED IN ~~LOC~~ HOSPITAL LAB): SARS Coronavirus 2: NEGATIVE

## 2018-12-02 LAB — ETHANOL: Alcohol, Ethyl (B): <10 mg/dL (ref <10)

## 2018-12-02 LAB — CK: Total CK: 110 U/L (ref 38–234)

## 2018-12-02 LAB — ACETAMINOPHEN LEVEL: Acetaminophen (Tylenol), Serum: <10 ug/mL — ABNORMAL LOW (ref 10–30)

## 2018-12-02 MED ORDER — VITAMIN B-1 100 MG PO TABS
100.0000 mg | ORAL_TABLET | Freq: Every day | ORAL | Status: DC
  Administered 2018-12-02: 100 mg via ORAL
  Filled 2018-12-02 (×2): qty 1

## 2018-12-02 MED ORDER — THIAMINE HCL 100 MG/ML IJ SOLN: 100.0000 mg | Freq: Every day | INTRAMUSCULAR | Status: DC

## 2018-12-02 MED ORDER — ONDANSETRON HCL 4 MG/2ML IJ SOLN: 4.0000 mg | Freq: Four times a day (QID) | INTRAMUSCULAR | Status: DC | PRN

## 2018-12-02 MED ORDER — LORAZEPAM 1 MG PO TABS
1.0000 mg | ORAL_TABLET | Freq: Four times a day (QID) | ORAL | Status: DC | PRN
  Administered 2018-12-03 (×2): 1 mg via ORAL
  Filled 2018-12-02 (×2): qty 1

## 2018-12-02 MED ORDER — SODIUM CHLORIDE 0.9 % IV SOLN
10.0000 mL/h | Freq: Once | INTRAVENOUS | Status: AC
  Administered 2018-12-02: 10 mL/h via INTRAVENOUS

## 2018-12-02 MED ORDER — LORAZEPAM 2 MG/ML IJ SOLN: 0.0000 mg | Freq: Two times a day (BID) | INTRAMUSCULAR | Status: DC

## 2018-12-02 MED ORDER — FOLIC ACID 1 MG PO TABS
1.0000 mg | ORAL_TABLET | Freq: Every day | ORAL | Status: DC
  Administered 2018-12-02 – 2018-12-03 (×2): 1 mg via ORAL
  Filled 2018-12-02 (×2): qty 1

## 2018-12-02 MED ORDER — ADULT MULTIVITAMIN W/MINERALS CH
1.0000 | ORAL_TABLET | Freq: Every day | ORAL | Status: DC
  Administered 2018-12-02 – 2018-12-03 (×2): 1 via ORAL
  Filled 2018-12-02 (×2): qty 1

## 2018-12-02 MED ORDER — ONDANSETRON HCL 4 MG PO TABS: 4.0000 mg | ORAL_TABLET | Freq: Four times a day (QID) | ORAL | Status: DC | PRN

## 2018-12-02 MED ORDER — BISACODYL 5 MG PO TBEC
10.0000 mg | DELAYED_RELEASE_TABLET | Freq: Every day | ORAL | Status: DC
  Administered 2018-12-02 – 2018-12-03 (×2): 10 mg via ORAL
  Filled 2018-12-02 (×2): qty 2

## 2018-12-02 MED ORDER — CYCLOBENZAPRINE HCL 10 MG PO TABS
10.0000 mg | ORAL_TABLET | Freq: Three times a day (TID) | ORAL | Status: DC | PRN
  Administered 2018-12-02: 10 mg via ORAL
  Filled 2018-12-02 (×2): qty 1

## 2018-12-02 MED ORDER — SENNOSIDES-DOCUSATE SODIUM 8.6-50 MG PO TABS
2.0000 | ORAL_TABLET | Freq: Every day | ORAL | Status: DC
  Administered 2018-12-02 – 2018-12-03 (×2): 2 via ORAL
  Filled 2018-12-02 (×2): qty 2

## 2018-12-02 MED ORDER — PANTOPRAZOLE SODIUM 40 MG IV SOLR
40.0000 mg | Freq: Once | INTRAVENOUS | Status: AC
  Administered 2018-12-02: 40 mg via INTRAVENOUS
  Filled 2018-12-02: qty 40

## 2018-12-02 MED ORDER — VITAMIN B-12 1000 MCG PO TABS
500.0000 ug | ORAL_TABLET | Freq: Every day | ORAL | Status: DC
  Administered 2018-12-02 – 2018-12-03 (×2): 500 ug via ORAL
  Filled 2018-12-02 (×2): qty 1

## 2018-12-02 MED ORDER — LORAZEPAM 2 MG/ML IJ SOLN: 0.0000 mg | Freq: Four times a day (QID) | INTRAMUSCULAR | Status: DC

## 2018-12-02 MED ORDER — SODIUM CHLORIDE 0.9 % IV SOLN
510.0000 mg | Freq: Once | INTRAVENOUS | Status: AC
  Administered 2018-12-02: 510 mg via INTRAVENOUS
  Filled 2018-12-02: qty 17

## 2018-12-02 MED ORDER — PANTOPRAZOLE SODIUM 40 MG PO TBEC
40.0000 mg | DELAYED_RELEASE_TABLET | Freq: Two times a day (BID) | ORAL | Status: DC
  Administered 2018-12-02 – 2018-12-03 (×3): 40 mg via ORAL
  Filled 2018-12-02 (×3): qty 1

## 2018-12-02 MED ORDER — NICOTINE 21 MG/24HR TD PT24
21.0000 mg | MEDICATED_PATCH | Freq: Every day | TRANSDERMAL | Status: DC
  Administered 2018-12-02: 21 mg via TRANSDERMAL
  Filled 2018-12-02 (×2): qty 1

## 2018-12-02 MED ORDER — POTASSIUM CHLORIDE CRYS ER 20 MEQ PO TBCR
60.0000 meq | EXTENDED_RELEASE_TABLET | Freq: Once | ORAL | Status: AC
  Administered 2018-12-02: 60 meq via ORAL
  Filled 2018-12-02: qty 3

## 2018-12-02 MED ORDER — BISACODYL 10 MG RE SUPP
10.0000 mg | Freq: Once | RECTAL | Status: AC
  Administered 2018-12-02: 10 mg via RECTAL
  Filled 2018-12-02: qty 1

## 2018-12-02 MED ORDER — LORAZEPAM 2 MG/ML IJ SOLN: 1.0000 mg | Freq: Four times a day (QID) | INTRAMUSCULAR | Status: DC | PRN

## 2018-12-02 MED ORDER — KETOROLAC TROMETHAMINE 30 MG/ML IJ SOLN
30.0000 mg | Freq: Once | INTRAMUSCULAR | Status: AC
  Administered 2018-12-02: 30 mg via INTRAVENOUS
  Filled 2018-12-02: qty 1

## 2018-12-02 NOTE — H&P (Addendum)
History and Physical    Melody CarolinaStacey M Notaro WUJ:[id number] DOB: January 04, 1976 DOA: 12/02/2018  Referring MD/NP/PA:   PCP: Patient, No Pcp Per   Patient coming from:  The patient is coming from homeless  At baseline, pt is independent for most of ADL.        Chief Complaint: homeless issue, generalized weakness, dark stool, suicidal ideation.  HPI: Melody Alexander is a 43 y.o. female with medical history significant of homelessness, GERD, depression, alcohol abuse, tobacco abuse, Barrett's esophagitis, who presents with homeless issue, generalized weakness, dark stool, suicidal ideation.  Pt is very reluctant to talk and does not want to give detailed medical history.  The history is limited.  Patient states that she has generalized weakness recently.  She states that she feels depressed because she is homeless and sometimes want to die.  She does not have a plan for suicide.  Denies homicidal ideations.  He reports having a suicidal attempt in the past, but not this time.  She states that she has dark stool sometimes, but not today.  Denies chest pain, shortness of breath, cough.  No nausea vomiting, diarrhea or abdominal pain.  Denies symptoms of UTI.  She moves all extremities normally.  No facial droop or slurred speech. She was seen in the ED yesterday for generalized weakness and concern for her social situation, but left before her work-up was complete.  ED Course: pt was found to have hemoglobin 7.5 (11.9 on 10/11/2017), negative FOBT, pending COVID-19 test, alcohol level less than 10, negative pregnancy test, Tylenol level less than 10, salicylate level less than 7, potassium 3.3, renal function normal, temperature 99.1, tachycardia, oxygen saturation 100% on room air, blood pressure 124/92.  Patient is placed on MedSurg bed for observation.  Review of Systems:   General: no fevers, chills, no body weight gain, has poor appetite, has fatigue HEENT: no blurry vision, hearing changes or  sore throat Respiratory: no dyspnea, coughing, wheezing CV: no chest pain, no palpitations GI: no nausea, vomiting, abdominal pain, diarrhea, constipation. Has dark stool.  GU: no dysuria, burning on urination, increased urinary frequency, hematuria  Ext: no leg edema Neuro: no unilateral weakness, numbness, or tingling, no vision change or hearing loss Skin: no rash, no skin tear. MSK: No muscle spasm, no deformity, no limitation of range of movement in spin Heme: No easy bruising.  Travel history: No recent long distant travel. Psych: has SI and depression  Allergy:  Allergies  Allergen Reactions  . Dilaudid [Hydromorphone Hcl] Hives  . Norco [Hydrocodone-Acetaminophen] Hives    Past Medical History:  Diagnosis Date  . Acid reflux   . Alcohol addiction (HCC)   . Barrett esophagus   . Depression     Past Surgical History:  Procedure Laterality Date  . CHOLECYSTECTOMY    . NOSE SURGERY    . TONSILLECTOMY      Social History:  reports that she has been smoking. She has been smoking about 0.50 packs per day. She has never used smokeless tobacco. She reports current alcohol use. She reports current drug use. Drug: Marijuana.  Family History:  Family History  Problem Relation Age of Onset  . Healthy Mother   . Heart disease Father      Prior to Admission medications   Medication Sig Start Date End Date Taking? Authorizing Provider  dexlansoprazole (DEXILANT) 60 MG capsule Take 60 mg by mouth daily.   Yes [provider]  esomeprazole (NEXIUM) 40 MG capsule Take 1 capsule (  40 mg total) by mouth daily before breakfast. For acid reflux Patient not taking: Reported on 12/02/2018 11/18/11   Lindell Spar I, NP  mupirocin ointment (BACTROBAN) 2 % Place 1 application into the nose 2 (two) times daily. Patient not taking: Reported on 12/02/2018 12/16/17   Wieters, Madelynn Done C, PA-C  vitamin B-12 (CYANOCOBALAMIN) 500 MCG tablet Take 1 tablet (500 mcg total) by mouth daily. For  B12 supplement Patient not taking: Reported on 12/02/2018 11/18/11   Lindell Spar I, NP    Physical Exam: Vitals:   12/02/18 0336 12/02/18 0559 12/02/18 0629  BP: (!) 124/92 113/66 109/73  Pulse: 100 85 77  Resp: 15 14 18   Temp: 99.1 F (37.3 C)  99.4 F (37.4 C)  TempSrc: Oral  Oral  SpO2: 100% 100% 100%  Weight: 49.9 kg     General: Not in acute distress. Pale looking. HEENT:       Eyes: PERRL, EOMI, no scleral icterus.       ENT: No discharge from the ears and nose, no pharynx injection, no tonsillar enlargement.        Neck: No JVD, no bruit, no mass felt. Heme: No neck lymph node enlargement. Cardiac: S1/S2, RRR, No murmurs, No gallops or rubs. Respiratory: No rales, wheezing, rhonchi or rubs. GI: Soft, nondistended, nontender, no rebound pain, no organomegaly, BS present. GU: No hematuria Ext: No pitting leg edema bilaterally. 2+DP/PT pulse bilaterally. Musculoskeletal: No joint deformities, No joint redness or warmth, no limitation of ROM in spin. Skin: No rashes.  Neuro: Alert, oriented X3, cranial nerves II-XII grossly intact, moves all extremities normally.  Psych: has suicidal but not hemocidal ideation. With depressed mood.  Labs on Admission: I have personally reviewed following labs and imaging studies  CBC: Recent Labs  Lab 12/02/18 0353  WBC 6.1  HGB 7.5*  HCT 31.5*  MCV 66.0*  PLT 102*   Basic Metabolic Panel: Recent Labs  Lab 12/02/18 0353  NA 137  K 3.3*  CL 104  CO2 23  GLUCOSE 83  BUN 13  CREATININE 0.51  CALCIUM 8.9   GFR: CrCl cannot be calculated (Unknown ideal weight.). Liver Function Tests: Recent Labs  Lab 12/02/18 0353  AST 23  ALT 15  ALKPHOS 61  BILITOT 0.1*  PROT 7.7  ALBUMIN 4.0   No results for input(s): LIPASE, AMYLASE in the last 168 hours. No results for input(s): AMMONIA in the last 168 hours. Coagulation Profile: No results for input(s): INR, PROTIME in the last 168 hours. Cardiac Enzymes: Recent Labs   Lab 12/02/18 0502  CKTOTAL 110   BNP (last 3 results) No results for input(s): PROBNP in the last 8760 hours. HbA1C: No results for input(s): HGBA1C in the last 72 hours. CBG: No results for input(s): GLUCAP in the last 168 hours. Lipid Profile: No results for input(s): CHOL, HDL, LDLCALC, TRIG, CHOLHDL, LDLDIRECT in the last 72 hours. Thyroid Function Tests: No results for input(s): TSH, T4TOTAL, FREET4, T3FREE, THYROIDAB in the last 72 hours. Anemia Panel: Recent Labs    12/02/18 0502  VITAMINB12 225  FERRITIN 3*  TIBC 603*  IRON 11*  RETICCTPCT 1.2   Urine analysis:    Component Value Date/Time   COLORURINE YELLOW 02/11/2011 [place] 02/11/2011 1604   LABSPEC 1.018 02/11/2011 1604   PHURINE 7.0 02/11/2011 [place] 02/11/2011 1604   HGBUR NEGATIVE 02/11/2011 1604   BILIRUBINUR NEGATIVE 02/11/2011 1604   KETONESUR 15 (A) 02/11/2011 1604  PROTEINUR NEGATIVE 02/11/2011 1604   UROBILINOGEN 0.2 02/11/2011 1604   NITRITE NEGATIVE 02/11/2011 1604   LEUKOCYTESUR SMALL (A) 02/11/2011 1604   Sepsis Labs: @LABRCNTIP (procalcitonin:4,lacticidven:4) )No results found for this or any previous visit (from the past 240 hour(s)).   Radiological Exams on Admission: No results found.   EKG:  Not done in ED, will get one.   Assessment/Plan Principal Problem:   Symptomatic anemia Active Problems:   Alcoholism (HCC)   Microcytic anemia   Tobacco abuse   Homeless   GERD (gastroesophageal reflux disease)   Depression   Suicidal ideation   Hypokalemia   Possible ymptomatic anemia: Patient has generalized weakness, which can be partially explained by depression, but her hemoglobin dropped from 11.9 on 10/11/17-7.5 today, indicating possible symptomatic anemia.  She reports dark stool recently, but FOBT negative.  Patient may have occult GI blood loss.  -Placed on MedSurg bed for observation -1 unit of blood was ordered by EDP -Protonix 40  mg twice daily (patient received 1 dose of IV Protonix 40 mg in ED) -Repeat CBC in AM -May need to discuss with GI (FOBT negative today) -Keep n.p.o. at this moment, if no GI work-up needed, please feed patient.  Tobacco abuse and Alcohol abuse: -Did counseling about importance of quitting smoking -Nicotine patch -Did counseling about the importance of quitting drinking -CIWA protocol  Microcytic anemia: Likely due to GI blood loss -check anemia panel  Homeless: -consult SW and CM  GERD (gastroesophageal reflux disease): -on protonix  Depression and Suicidal ideation: -Suicidal precaution -psych consult is requested vic Epic -check TSH level  Hypokalemia: K= 3.3 on admission. - Repleted - Check Mg level   DVT ppx: SCD Code Status: Full code Family Communication: None at bed side.    Disposition Plan:  To to determined Consults called:  None Admission status:   medical floor/obs     Date of Service 12/02/2018    Lorretta HarpXilin Tadd Holtmeyer [facility]   If 7PM-7AM, please contact night-coverage [email or web address] Password [email or web address] 12/02/2018, 6:43 AM

## 2018-12-02 NOTE — ED Provider Notes (Signed)
[facility]-EMERGENCY DEPT Provider Note   CSN: [id number] Arrival date & time: 12/02/18  0327     History   Chief Complaint Chief Complaint  Patient presents with  . Depression    HPI Melody Alexander is a 43 y.o. female.     Patient with history of acid reflux, depression presenting with concern for homelessness and "hopelessness".  States she is depressed because she is homeless and "sometimes I just want to die".  She denies having a plan for suicide.  She reports having a suicide attempt in the past.  She believes that the heat outside is affecting her health.  She was seen in the ED yesterday for generalized weakness and concern for her social situation but left before her work-up was complete.  She has no specific plan for suicide.  She denies any homicidal thoughts or hallucinations.  She denies any alcohol abuse currently but has in the past.  Denies any other drug use.  States she is not physically hurting in any one particular location.  She feels generally weak has not had any nausea, vomiting or diarrhea.  No syncope.  The history is provided by the patient.  Depression Pertinent negatives include no abdominal pain, no headaches and no shortness of breath.    Past Medical History:  Diagnosis Date  . Acid reflux   . Alcohol addiction (HCC)   . Barrett esophagus   . Depression     Patient Active Problem List   Diagnosis Date Noted  . Pancreatitis 11/16/2011    Class: Acute  . Alcoholism (HCC) 11/11/2011  . Esophagitis 11/11/2011    Past Surgical History:  Procedure Laterality Date  . CHOLECYSTECTOMY    . NOSE SURGERY    . TONSILLECTOMY       OB History   No obstetric history on file.      Home Medications    Prior to Admission medications   Medication Sig Start Date End Date Taking? Authorizing Provider  amoxicillin-clavulanate (AUGMENTIN) 875-125 MG tablet Take 1 tablet by mouth every 12 (twelve) hours. 12/16/17   Wieters,  Hallie C, PA-C  carbamazepine (TEGRETOL XR) 200 MG 12 hr tablet Take 1 tablet (200 mg total) by mouth 2 (two) times daily. For mood stabilization 11/18/11 11/17/12  Armandina StammerNwoko, Agnes I, NP  citalopram (CELEXA) 20 MG tablet Take 1 tablet (20 mg total) by mouth daily. For depression 11/18/11 11/17/12  Armandina StammerNwoko, Agnes I, NP  esomeprazole (NEXIUM) 40 MG capsule Take 1 capsule (40 mg total) by mouth daily before breakfast. For acid reflux 11/18/11   Armandina StammerNwoko, Agnes I, NP  gabapentin (NEURONTIN) 400 MG capsule Take 1 capsule (400 mg total) by mouth 3 (three) times daily. For anxiety 11/18/11 11/17/12  Armandina StammerNwoko, Agnes I, NP  mupirocin ointment (BACTROBAN) 2 % Place 1 application into the nose 2 (two) times daily. 12/16/17   Wieters, Hallie C, PA-C  potassium chloride (KLOR-CON) 10 MEQ CR tablet Take 2 tablets (20 mEq total) by mouth 2 (two) times daily. For Potassium supplement 11/18/11   Armandina StammerNwoko, Agnes I, NP  sucralfate (CARAFATE) 1 G tablet Take 1 tablet (1 g total) by mouth 4 (four) times daily -  before meals and at bedtime. For acid reflux/ulcers 11/18/11 11/17/12  Armandina StammerNwoko, Agnes I, NP  vitamin B-12 (CYANOCOBALAMIN) 500 MCG tablet Take 1 tablet (500 mcg total) by mouth daily. For B12 supplement 11/18/11   Sanjuana KavaNwoko, Agnes I, NP    Family History Family History  Problem Relation Age  of Onset  . Healthy Mother   . Heart disease Father     Social History Social History   Tobacco Use  . Smoking status: Current Every Day Smoker    Packs/day: 0.50  . Smokeless tobacco: Never Used  Substance Use Topics  . Alcohol use: Yes    Comment: last drank on Monday - drinks a pt   . Drug use: Yes    Types: Marijuana     Allergies   Dilaudid [hydromorphone hcl] and Norco [hydrocodone-acetaminophen]   Review of Systems Review of Systems  Constitutional: Positive for activity change, appetite change and fatigue.  Respiratory: Negative for cough, chest tightness and shortness of breath.   Gastrointestinal: Negative for abdominal  pain, nausea and vomiting.  Genitourinary: Negative for dysuria and hematuria.  Musculoskeletal: Positive for arthralgias.  Skin: Negative for rash.  Neurological: Positive for weakness. Negative for dizziness and headaches.  Psychiatric/Behavioral: Positive for depression.    all other systems are negative except as noted in the HPI and PMH.    Physical Exam Updated Vital Signs BP (!) 124/92 (BP Location: Left Arm)   Pulse 100   Temp 99.1 F (37.3 C) (Oral)   Resp 15   Wt 49.9 kg   SpO2 100%   BMI 20.78 kg/m   Physical Exam Vitals signs and nursing note reviewed.  Constitutional:      General: She is not in acute distress.    Appearance: She is well-developed.     Comments: Flat affect, withdrawn, won't stay awake for questioning  HENT:     Head: Normocephalic and atraumatic.     Mouth/Throat:     Pharynx: No oropharyngeal exudate.  Eyes:     Conjunctiva/sclera: Conjunctivae normal.     Pupils: Pupils are equal, round, and reactive to light.  Neck:     Musculoskeletal: Normal range of motion and neck supple.     Comments: No meningismus. Cardiovascular:     Rate and Rhythm: Normal rate and regular rhythm.     Heart sounds: Normal heart sounds. No murmur.  Pulmonary:     Effort: Pulmonary effort is normal. No respiratory distress.     Breath sounds: Normal breath sounds.  Abdominal:     Palpations: Abdomen is soft.     Tenderness: There is no abdominal tenderness. There is no guarding or rebound.  Musculoskeletal: Normal range of motion.        General: No tenderness.  Skin:    General: Skin is warm.  Neurological:     General: No focal deficit present.     Mental Status: She is alert and oriented to person, place, and time. Mental status is at baseline.     Cranial Nerves: No cranial nerve deficit.     Motor: No abnormal muscle tone.     Coordination: Coordination normal.     Comments:  5/5 strength throughout. CN 2-12 intact.Equal grip strength.  Answers  questions appropriately  Psychiatric:        Behavior: Behavior normal.      ED Treatments / Results  Labs (all labs ordered are listed, but only abnormal results are displayed) Labs Reviewed  COMPREHENSIVE METABOLIC PANEL - Abnormal; Notable for the following components:      Result Value   Potassium 3.3 (*)    Total Bilirubin 0.1 (*)    All other components within normal limits  ACETAMINOPHEN LEVEL - Abnormal; Notable for the following components:   Acetaminophen (Tylenol), Serum <10 (*)  All other components within normal limits  CBC - Abnormal; Notable for the following components:   Hemoglobin 7.5 (*)    HCT 31.5 (*)    MCV 66.0 (*)    MCH 15.7 (*)    MCHC 23.8 (*)    RDW 21.1 (*)    Platelets 467 (*)    All other components within normal limits  IRON AND TIBC - Abnormal; Notable for the following components:   Iron 11 (*)    TIBC 603 (*)    Saturation Ratios 2 (*)    All other components within normal limits  FERRITIN - Abnormal; Notable for the following components:   Ferritin 3 (*)    All other components within normal limits  RETICULOCYTES - Abnormal; Notable for the following components:   Immature Retic Fract 28.8 (*)    All other components within normal limits  SARS CORONAVIRUS 2 (HOSPITAL ORDER, PERFORMED IN [facility])  ETHANOL  SALICYLATE LEVEL  CK  VITAMIN B12  RAPID URINE DRUG SCREEN, HOSP PERFORMED  CARBAMAZEPINE LEVEL, TOTAL  FOLATE  MAGNESIUM  HIV ANTIBODY (ROUTINE TESTING W REFLEX)  I-STAT BETA HCG BLOOD, ED ([facility], [facility], [facility] ONLY)  POC OCCULT BLOOD, ED  PREPARE RBC (CROSSMATCH)  TYPE AND SCREEN    EKG None  Radiology No results found.  Procedures Procedures (including critical care time)  Medications Ordered in ED Medications - No data to display   Initial Impression / Assessment and Plan / ED Course  I have reviewed the triage vital signs and the nursing notes.  Pertinent labs & imaging results that were  available during my care of the patient were reviewed by me and considered in my medical decision making (see chart for details).       Generalized weakness with homelessness and hopelessness.  States she is "sometimes just wants to die".  Patient found to have hemoglobin of 7.5 in her screening labs.  This is down from 11 from September 2019.  States she had an endoscopy about 3 years ago that showed she has some polyps.  Denies any dark or bloody stools and states she has been constipated. Reports no alcohol intake for several years.  Has never needed a blood transfusion.  Patient has negative Hemoccult today but reports intermittent black stools.  History of heavy alcohol abuse in the past.  Will start PPI.  Patient's profound drop in hemoglobin may be contributing to her generalized weakness and malaise.  Feel she would benefit from further evaluation blood transfusion before psychiatric evaluation. She remains hemodynamically stable in the ED.  Admission discussed with Dr. Clyde LundborgNiu.  CRITICAL CARE Performed by: Glynn OctaveStephen Jayliani Wanner Total critical care time: 31 minutes Critical care time was exclusive of separately billable procedures and treating other patients. Critical care was necessary to treat or prevent imminent or life-threatening deterioration. Critical care was time spent personally by me on the following activities: development of treatment plan with patient and/or surrogate as well as nursing, discussions with consultants, evaluation of patient's response to treatment, examination of patient, obtaining history from patient or surrogate, ordering and performing treatments and interventions, ordering and review of laboratory studies, ordering and review of radiographic studies, pulse oximetry and re-evaluation of patient's condition.   Final Clinical Impressions(s) / ED Diagnoses   Final diagnoses:  Current episode of major depressive disorder without prior episode, unspecified  depression episode severity    ED Discharge Orders    None       Tristram Milian, Jeannett SeniorStephen,  MD 12/02/18 6168

## 2018-12-02 NOTE — ED Notes (Signed)
ED TO INPATIENT HANDOFF REPORT  Name/Age/Gender Melody Alexander 43 y.o. female  Code Status Code Status History    Date Active Date Inactive Code Status Order ID Comments User Context   11/09/2011 0711 11/10/2011 1807 Full Code [id number]  Melody Alexander, Melody C., PA ED   04/20/2011 1947 04/21/2011 1831 Full Code [id number]  Melody Alexander, [name], MD ED   Advance Care Planning Activity      Home/SNF/Other Home  Chief Complaint Depression, anxiety  Level of Care/Admitting Diagnosis ED Disposition    ED Disposition Condition Comment   [facility] [[id number]]  Level of Care: Med-Surg [16]  Covid Evaluation: Asymptomatic Screening Protocol (No Symptoms)  Diagnosis: Symptomatic anemia [[id number]][[id number]]  Admitting Physician: Lorretta HarpNIU, XILIN [[id number]]  Attending Physician: Lorretta HarpNIU, XILIN [[id number]]  PT Class (Do Not Modify): Observation [104]  PT Acc Code (Do Not Modify): Observation [10022]       Medical History Past Medical History:  Diagnosis Date  . Acid reflux   . Alcohol addiction (HCC)   . Barrett esophagus   . Depression     Allergies Allergies  Allergen Reactions  . Dilaudid [Hydromorphone Hcl] Hives  . Norco [Hydrocodone-Acetaminophen] Hives    IV Location/Drains/Wounds Patient Lines/Drains/Airways Status   Active Line/Drains/Airways    None          Labs/Imaging Results for orders placed or performed during the Alexander encounter of 12/02/18 (from the past 48 hour(s))  Comprehensive metabolic panel     Status: Abnormal   Collection Time: 12/02/18  3:53 AM  Result Value Ref Range   Sodium 137 135 - 145 mmol/L   Potassium 3.3 (L) 3.5 - 5.1 mmol/L   Chloride 104 98 - 111 mmol/L   CO2 23 22 - 32 mmol/L   Glucose, Bld 83 70 - 99 mg/dL   BUN 13 6 - 20 mg/dL   Creatinine, Ser 5.280.51 0.44 - 1.00 mg/dL   Calcium 8.9 8.9 - 41.310.3 mg/dL   Total Protein 7.7 6.5 - 8.1 g/dL   Albumin 4.0 3.5 - 5.0 g/dL   AST 23 15 - 41 U/L   ALT 15 0 - 44 U/L    Alkaline Phosphatase 61 38 - 126 U/L   Total Bilirubin 0.1 (L) 0.3 - 1.2 mg/dL   GFR calc non Af Amer >60 >60 mL/min   GFR calc Af Amer >60 >60 mL/min   Anion gap 10 5 - 15    Comment: Performed at [facility], [address] [place], [place] [address]  cbc     Status: Abnormal   Collection Time: 12/02/18  3:53 AM  Result Value Ref Range   WBC 6.1 4.0 - 10.5 K/uL   RBC 4.77 3.87 - 5.11 MIL/uL   Hemoglobin 7.5 (L) 12.0 - 15.0 g/dL    Comment: Reticulocyte Hemoglobin testing may be clinically indicated, consider ordering this additional test [id number]    HCT 31.5 (L) 36.0 - 46.0 %   MCV 66.0 (L) 80.0 - 100.0 fL    Comment: REPEATED TO VERIFY   MCH 15.7 (L) 26.0 - 34.0 pg   MCHC 23.8 (L) 30.0 - 36.0 g/dL   RDW 44.021.1 (H) 34.711.5 - 42.515.5 %   Platelets 467 (H) 150 - 400 K/uL    Comment: REPEATED TO VERIFY   nRBC 0.0 0.0 - 0.2 %    Comment: Performed at [facility], [address] [place], [place] [address]  Ethanol     Status:  None   Collection Time: 12/02/18  3:57 AM  Result Value Ref Range   Alcohol, Ethyl (B) <10 <10 mg/dL    Comment: (NOTE) Lowest detectable limit for serum alcohol is 10 mg/dL. For medical purposes only. Performed at [facility], [address] [place], [place] [address]   Salicylate level     Status: None   Collection Time: 12/02/18  3:57 AM  Result Value Ref Range   Salicylate Lvl <7.0 2.8 - 30.0 mg/dL    Comment: Performed at [facility], [address] [place], [place] [address]  Acetaminophen level     Status: Abnormal   Collection Time: 12/02/18  3:57 AM  Result Value Ref Range   Acetaminophen (Tylenol), Serum <10 (L) 10 - 30 ug/mL    Comment: (NOTE) Therapeutic concentrations vary significantly. A range of 10-30 ug/mL  may be an effective concentration for many patients. However, some  are best treated at concentrations outside of this range. Acetaminophen  concentrations >150 ug/mL at 4 hours after ingestion  and >50 ug/mL at 12 hours after ingestion are often associated with  toxic reactions. Performed at [facility], [address] [place], [place] [address]   I-Stat beta hCG blood, ED     Status: None   Collection Time: 12/02/18  4:02 AM  Result Value Ref Range   I-stat hCG, quantitative <5.0 <5 mIU/mL   Comment 3            Comment:   GEST. AGE      CONC.  (mIU/mL)   <=1 WEEK        5 - 50     2 WEEKS       50 - 500     3 WEEKS       100 - 10,000     4 WEEKS     1,000 - 30,000        FEMALE AND NON-PREGNANT FEMALE:     LESS THAN 5 mIU/mL   POC occult blood, ED Provider will collect     Status: None   Collection Time: 12/02/18  4:44 AM  Result Value Ref Range   Fecal Occult Bld NEGATIVE NEGATIVE  CK     Status: None   Collection Time: 12/02/18  5:02 AM  Result Value Ref Range   Total CK 110 38 - 234 U/L    Comment: Performed at [facility], [address] [place], [place] [address]  Reticulocytes     Status: Abnormal   Collection Time: 12/02/18  5:02 AM  Result Value Ref Range   Retic Ct Pct 1.2 0.4 - 3.1 %   RBC. 4.90 3.87 - 5.11 MIL/uL   Retic Count, Absolute 56.8 19.0 - 186.0 K/uL   Immature Retic Fract 28.8 (H) 2.3 - 15.9 %    Comment: Performed at [facility], [address] [place], [place] [address]   No results found.  Pending Labs Unresulted Labs (From admission, onward)    Start     Ordered   12/02/18 0534  SARS Coronavirus 2 (CEPHEID - Performed in [facility] lab), [facility] Order  Once,   STAT    Question:  Rule Out  Answer:  Yes   12/02/18 0533   12/02/18 0531  Type and screen  Once,   STAT     12/02/18 0531   12/02/18 0516  Prepare RBC  (Adult Blood Administration - PRBC)  Once,  R    Question Answer Comment  # of Units 1 unit   Transfusion Indications Symptomatic Anemia   If emergent release call blood bank Not emergent release       12/02/18 0515   12/02/18 0427  Vitamin B12  (Anemia Panel (PNL))  ONCE - STAT,   STAT     12/02/18 0426   12/02/18 0427  Folate  (Anemia Panel (PNL))  ONCE - STAT,   STAT     12/02/18 0426   12/02/18 0427  Iron and TIBC  (Anemia Panel (PNL))  ONCE - STAT,   STAT     12/02/18 0426   12/02/18 0427  Ferritin  (Anemia Panel (PNL))  ONCE - STAT,   STAT     12/02/18 0426   12/02/18 0419  Carbamazepine level, total  ONCE - STAT,   STAT     12/02/18 0418   12/02/18 0343  Rapid urine drug screen (Alexander performed)  Once,   STAT     12/02/18 0343   Signed and Held  Magnesium  Once,   R     Signed and Held   Signed and Held  TSH  Tomorrow morning,   R     Signed and Held   Signed and Held  SARS Coronavirus 2 (CEPHEID - Performed in [place] [facility] lab), [employer]  (Asymptomatic Patients Labs)  Once,   R    Question:  Rule Out  Answer:  Yes   Signed and Held   Signed and Held  HIV antibody (Routine Testing)  Once,   R     Signed and Held   Signed and Held  Basic metabolic panel  Tomorrow morning,   R     Signed and Held   Signed and Held  CBC  Tomorrow morning,   R     Signed and Held          Vitals/Pain Today's Vitals   12/02/18 0336 12/02/18 0340  BP: (!) 124/92   Pulse: 100   Resp: 15   Temp: 99.1 F (37.3 C)   TempSrc: Oral   SpO2: 100%   Weight: 49.9 kg   PainSc:  0-No pain    Isolation Precautions No active isolations  Medications Medications  pantoprazole (PROTONIX) injection 40 mg (has no administration in time range)  0.9 %  sodium chloride infusion (has no administration in time range)    Mobility walks

## 2018-12-02 NOTE — Progress Notes (Signed)
Received this patient Melody Alexander to room 1521 with Dx of symptomatic anemia. No acute distress noted. VSS and afebrile at this time.This patient is withdrawn, has a flat affect,  is uncooperative and not answering this nurse questions or following nurse's commands. Melody Alexander not interacting with Scientist, research (physical sciences). Unable to obtain H&P or assessments at this time.

## 2018-12-02 NOTE — ED Notes (Signed)
Pt and all belongings transported upstairs.  

## 2018-12-02 NOTE — ED Notes (Signed)
Attempted to call report. Rn unavailable at this time and will call back

## 2018-12-02 NOTE — Progress Notes (Signed)
PROGRESS NOTE    Melody AxonStacey M Alexander   WUJ:[id number]  DOB: 01/25/1976  DOA: 12/02/2018 PCP: Patient, No Pcp Per   Brief Narrative:  Melody CarolinaStacey M Alexander is a 43 y/o homeless female with a h/o Barrettes esophagus/ GERD, depression, alcohol abuse, tobacco abuse who presents with generalize weakness and is found to be anemic. She also is depressed and has suicidal ideation.    Subjective: She admits to occasionally vomiting up blood has bas not had hematemesis in > 1 wk. She states that it is usually a small amount of what looks like old blood and typically is not severe. She also has problems with severe constipation. She admits to depression.     Assessment & Plan:   Principal Problem:   Symptomatic anemia- Iron deficiency - from chronic blood loss anemia related to GERD and Barret's esophagus - will transfuse 1 U PRBC - will also infuse IV Iron as she is severely iron deficient -she will need to be placed on oral Iron - as she is currently not bleeding, do not need to call GI  Active Problems:   Alcoholism - have advised to stop drinking and advised that it will make her GERD worse - Ativan/ CIWA scale ordered  GERD/ Barret's esophagus - cont PPI-  Severe constipation - have ordered Dulcolax suppository, Senna and oral Dulcolax    Tobacco abuse - advised to stop smoking    Depression   Suicidal ideation - awaiting a psych consult- sitter at bedside-     Hypokalemia - replaced   homeless - request social work consult to assist with this   Time spent in minutes: 35 min DVT prophylaxis: SCDs Code Status: Full code Family Communication:  Disposition Plan: f/u on psych eval Consultants:   psych Procedures:   none Antimicrobials:  Anti-infectives (From admission, onward)   None       Objective: Vitals:   12/02/18 0336 12/02/18 0559 12/02/18 0629 12/02/18 0922  BP: (!) 124/92 113/66 109/73 116/80  Pulse: 100 85 77 96  Resp: 15 14 18 16   Temp:  99.1 F (37.3 C)  99.4 F (37.4 C) 99.1 F (37.3 C)  TempSrc: Oral  Oral Oral  SpO2: 100% 100% 100% 99%  Weight: 49.9 kg      No intake or output data in the 24 hours ending 12/02/18 1324 Filed Weights   12/02/18 0336  Weight: 49.9 kg    Examination: General exam: Appears comfortable  HEENT: PERRLA, oral mucosa moist, no sclera icterus or thrush Respiratory system: Clear to auscultation. Respiratory effort normal. Cardiovascular system: S1 & S2 heard, RRR.   Gastrointestinal system: Abdomen soft, non-tender, nondistended. Normal bowel sounds. Central nervous system: Alert and oriented. No focal neurological deficits. Extremities: No cyanosis, clubbing or edema Skin: No rashes or ulcers Psychiatry:  depressed     Data Reviewed: I have personally reviewed following labs and imaging studies  CBC: Recent Labs  Lab 12/02/18 0353  WBC 6.1  HGB 7.5*  HCT 31.5*  MCV 66.0*  PLT 467*   Basic Metabolic Panel: Recent Labs  Lab 12/02/18 0353 12/02/18 0502  NA 137  --   K 3.3*  --   CL 104  --   CO2 23  --   GLUCOSE 83  --   BUN 13  --   CREATININE 0.51  --   CALCIUM 8.9  --   MG  --  2.4   GFR: CrCl cannot be calculated (Unknown ideal weight.). Liver Function Tests:  Recent Labs  Lab 12/02/18 0353  AST 23  ALT 15  ALKPHOS 61  BILITOT 0.1*  PROT 7.7  ALBUMIN 4.0   No results for input(s): LIPASE, AMYLASE in the last 168 hours. No results for input(s): AMMONIA in the last 168 hours. Coagulation Profile: No results for input(s): INR, PROTIME in the last 168 hours. Cardiac Enzymes: Recent Labs  Lab 12/02/18 0502  CKTOTAL 110   BNP (last 3 results) No results for input(s): PROBNP in the last 8760 hours. HbA1C: No results for input(s): HGBA1C in the last 72 hours. CBG: Recent Labs  Lab 12/02/18 0854  GLUCAP 109*   Lipid Profile: No results for input(s): CHOL, HDL, LDLCALC, TRIG, CHOLHDL, LDLDIRECT in the last 72 hours. Thyroid Function Tests: No  results for input(s): TSH, T4TOTAL, FREET4, T3FREE, THYROIDAB in the last 72 hours. Anemia Panel: Recent Labs    12/02/18 0502  VITAMINB12 225  FOLATE 69.5  FERRITIN 3*  TIBC 603*  IRON 11*  RETICCTPCT 1.2   Urine analysis:    Component Value Date/Time   COLORURINE YELLOW 02/11/2011 [place] 02/11/2011 1604   LABSPEC 1.018 02/11/2011 1604   PHURINE 7.0 02/11/2011 1604   GLUCOSEU NEGATIVE 02/11/2011 1604   HGBUR NEGATIVE 02/11/2011 1604   BILIRUBINUR NEGATIVE 02/11/2011 1604   KETONESUR 15 (A) 02/11/2011 1604   PROTEINUR NEGATIVE 02/11/2011 1604   UROBILINOGEN 0.2 02/11/2011 1604   NITRITE NEGATIVE 02/11/2011 1604   LEUKOCYTESUR SMALL (A) 02/11/2011 1604   Sepsis Labs: @LABRCNTIP (procalcitonin:4,lacticidven:4) ) Recent Results (from the past 240 hour(s))  SARS Coronavirus 2 (CEPHEID - Performed in [facility] lab), Hosp Order     Status: None   Collection Time: 12/02/18  5:34 AM   Specimen: Nasopharyngeal Swab  Result Value Ref Range Status   SARS Coronavirus 2 NEGATIVE NEGATIVE Final    Comment: (NOTE) If result is NEGATIVE SARS-CoV-2 target nucleic acids are NOT DETECTED. The SARS-CoV-2 RNA is generally detectable in upper and lower  respiratory specimens during the acute phase of infection. The lowest  concentration of SARS-CoV-2 viral copies this assay can detect is 250  copies / mL. A negative result does not preclude SARS-CoV-2 infection  and should not be used as the sole basis for treatment or other  patient management decisions.  A negative result may occur with  improper specimen collection / handling, submission of specimen other  than nasopharyngeal swab, presence of viral mutation(s) within the  areas targeted by this assay, and inadequate number of viral copies  (<250 copies / mL). A negative result must be combined with clinical  observations, patient history, and epidemiological information. If result is POSITIVE SARS-CoV-2  target nucleic acids are DETECTED. The SARS-CoV-2 RNA is generally detectable in upper and lower  respiratory specimens dur ing the acute phase of infection.  Positive  results are indicative of active infection with SARS-CoV-2.  Clinical  correlation with patient history and other diagnostic information is  necessary to determine patient infection status.  Positive results do  not rule out bacterial infection or co-infection with other viruses. If result is PRESUMPTIVE POSTIVE SARS-CoV-2 nucleic acids MAY BE PRESENT.   A presumptive positive result was obtained on the submitted specimen  and confirmed on repeat testing.  While 2019 novel coronavirus  (SARS-CoV-2) nucleic acids may be present in the submitted sample  additional confirmatory testing may be necessary for epidemiological  and / or clinical management purposes  to differentiate between  SARS-CoV-2 and other Sarbecovirus  currently known to infect humans.  If clinically indicated additional testing with an alternate test  methodology [phone] is advised. The SARS-CoV-2 RNA is generally  detectable in upper and lower respiratory sp ecimens during the acute  phase of infection. The expected result is Negative. Fact Sheet for Patients:  BoilerBrush.com.cyhttps://www.fda.gov/media/136312/download Fact Sheet for Healthcare Providers: https://pope.com/https://www.fda.gov/media/136313/download This test is not yet approved or cleared by the [place] FDA and has been authorized for detection and/or diagnosis of SARS-CoV-2 by FDA under an Emergency Use Authorization (EUA).  This EUA will remain in effect (meaning this test can be used) for the duration of the COVID-19 declaration under Section 564(b)(1) of the Act, 21 U.S.C. section 360bbb-3(b)(1), unless the authorization is terminated or revoked sooner. Performed at [facility], [address] [place], [place] [address]          Radiology Studies: No results found.     Scheduled Meds: . bisacodyl  10 mg Oral Daily  . bisacodyl  10 mg Rectal Once  . folic acid  1 mg Oral Daily  . LORazepam  0-4 mg Intravenous Q6H   Followed by  . [START ON 12/04/2018] LORazepam  0-4 mg Intravenous Q12H  . multivitamin with minerals  1 tablet Oral Daily  . nicotine  21 mg Transdermal Daily  . pantoprazole  40 mg Oral BID  . senna-docusate  2 tablet Oral Daily  . thiamine  100 mg Oral Daily   Or  . thiamine  100 mg Intravenous Daily  . vitamin B-12  500 mcg Oral Daily   Continuous Infusions:   LOS: 0 days      Calvert CantorSaima Ryler Laskowski, MD [facility] Pager: [email or web address] Password [email or web address] 12/02/2018, 1:24 PM

## 2018-12-02 NOTE — ED Triage Notes (Signed)
Pt presents with 2 large suitcases. She reports that she is depressed because she is homeless. She denies SI at this time, but states, "sometimes, I just want to die." Reports hx of suicidal attempts in the past. She also states that the heat outside is affecting her health. A&Ox4. Ambulatory.

## 2018-12-02 NOTE — ED Notes (Signed)
Recollect for type and screen sent to lab

## 2018-12-02 NOTE — ED Notes (Signed)
Pt aware that urine sample is needed.  

## 2018-12-02 NOTE — Consult Note (Signed)
Telepsych Consultation   Reason for Consult:  ''depression, suicidal'' Referring Physician:  Dr. Butler Denmark Location of Patient: [facility] Location of Provider: [facility]  Patient Identification: Melody Alexander MRN:  [id number] Principal Diagnosis: Symptomatic anemia Diagnosis:  Principal Problem:   Symptomatic anemia Active Problems:   Alcoholism (HCC)   Microcytic anemia   Tobacco abuse   Homeless   GERD (gastroesophageal reflux disease)   Depression   Suicidal ideation   Hypokalemia   Adjustment disorder with depressed mood   Total Time spent with patient: 45 minutes  Subjective:   Melody Alexander is a 43 y.o. female patient admitted due to generalized weakness, depression and suicidal thoughts .  HPI:  Patient who reports history of depression, Borderline personality disorder,  acid reflux who presents to the hospital with depression after she lost her section-8 home. On evaluation, patient reports that she was found to be anemic and was admitted to medical unit for the treatment of Anemia. Patient described her symptoms of depression as being situational due purely to loosing her section-8 home but says she in the process of getting another home. Today, she is calm, cooperative, denies psychosis, delusional thinking, suicidal ideation, intent or plan. Patient refused antidepressant or inpatient psychiatric admission but willing to be referred to a therapist upon discharge. She reports that her major stress is being homeless.  Past Psychiatric History: as above  Risk to Self:   denies Risk to Others:  denies Prior Inpatient Therapy:  none reported Prior Outpatient Therapy:  none  Past Medical History:  Past Medical History:  Diagnosis Date  . Acid reflux   . Alcohol addiction (HCC)   . Barrett esophagus   . Depression     Past Surgical History:  Procedure Laterality Date  . CHOLECYSTECTOMY    . NOSE SURGERY    . TONSILLECTOMY     Family History:   Family History  Problem Relation Age of Onset  . Healthy Mother   . Heart disease Father    Family Psychiatric  History: as above Social History:  Social History   Substance and Sexual Activity  Alcohol Use Yes   Comment: last drank on Monday - drinks a pt      Social History   Substance and Sexual Activity  Drug Use Yes  . Types: Marijuana    Social History   Socioeconomic History  . Marital status: Single    Spouse name: Not on file  . Number of children: Not on file  . Years of education: Not on file  . Highest education level: Not on file  Occupational History  . Not on file  Social Needs  . Financial resource strain: Not on file  . Food insecurity    Worry: Not on file    Inability: Not on file  . Transportation needs    Medical: Not on file    Non-medical: Not on file  Tobacco Use  . Smoking status: Current Every Day Smoker    Packs/day: 0.50  . Smokeless tobacco: Never Used  Substance and Sexual Activity  . Alcohol use: Yes    Comment: last drank on Monday - drinks a pt   . Drug use: Yes    Types: Marijuana  . Sexual activity: Not on file  Lifestyle  . Physical activity    Days per week: Not on file    Minutes per session: Not on file  . Stress: Not on file  Relationships  . Social connections  Talks on phone: Not on file    Gets together: Not on file    Attends religious service: Not on file    Active member of club or organization: Not on file    Attends meetings of clubs or organizations: Not on file    Relationship status: Not on file  Other Topics Concern  . Not on file  Social History Narrative  . Not on file   Additional Social History:    Allergies:   Allergies  Allergen Reactions  . Dilaudid [Hydromorphone Hcl] Hives  . Norco [Hydrocodone-Acetaminophen] Hives    Labs:  Results for orders placed or performed during the hospital encounter of 12/02/18 (from the past 48 hour(s))  Comprehensive metabolic panel     Status:  Abnormal   Collection Time: 12/02/18  3:53 AM  Result Value Ref Range   Sodium 137 135 - 145 mmol/L   Potassium 3.3 (L) 3.5 - 5.1 mmol/L   Chloride 104 98 - 111 mmol/L   CO2 23 22 - 32 mmol/L   Glucose, Bld 83 70 - 99 mg/dL   BUN 13 6 - 20 mg/dL   Creatinine, Ser 1.610.51 0.44 - 1.00 mg/dL   Calcium 8.9 8.9 - 09.610.3 mg/dL   Total Protein 7.7 6.5 - 8.1 g/dL   Albumin 4.0 3.5 - 5.0 g/dL   AST 23 15 - 41 U/L   ALT 15 0 - 44 U/L   Alkaline Phosphatase 61 38 - 126 U/L   Total Bilirubin 0.1 (L) 0.3 - 1.2 mg/dL   GFR calc non Af Amer >60 >60 mL/min   GFR calc Af Amer >60 >60 mL/min   Anion gap 10 5 - 15    Comment: Performed at [facility], [address] [place], [place] [address]  cbc     Status: Abnormal   Collection Time: 12/02/18  3:53 AM  Result Value Ref Range   WBC 6.1 4.0 - 10.5 K/uL   RBC 4.77 3.87 - 5.11 MIL/uL   Hemoglobin 7.5 (L) 12.0 - 15.0 g/dL    Comment: Reticulocyte Hemoglobin testing may be clinically indicated, consider ordering this additional test [id number]    HCT 31.5 (L) 36.0 - 46.0 %   MCV 66.0 (L) 80.0 - 100.0 fL    Comment: REPEATED TO VERIFY   MCH 15.7 (L) 26.0 - 34.0 pg   MCHC 23.8 (L) 30.0 - 36.0 g/dL   RDW 82.921.1 (H) 56.211.5 - 13.015.5 %   Platelets 467 (H) 150 - 400 K/uL    Comment: REPEATED TO VERIFY   nRBC 0.0 0.0 - 0.2 %    Comment: Performed at [facility], [address] [place], [place] [address]  Ethanol     Status: None   Collection Time: 12/02/18  3:57 AM  Result Value Ref Range   Alcohol, Ethyl (B) <10 <10 mg/dL    Comment: (NOTE) Lowest detectable limit for serum alcohol is 10 mg/dL. For medical purposes only. Performed at [facility], [address] [place], [place] [address]   Salicylate level     Status: None   Collection Time: 12/02/18  3:57 AM  Result Value Ref Range   Salicylate Lvl <7.0 2.8 - 30.0 mg/dL    Comment: Performed at [facility], [address] [place], [place] [address]  Acetaminophen level     Status: Abnormal   Collection Time: 12/02/18  3:57 AM  Result Value Ref Range   Acetaminophen (  Tylenol), Serum <10 (L) 10 - 30 ug/mL    Comment: (NOTE) Therapeutic concentrations vary significantly. A range of 10-30 ug/mL  may be an effective concentration for many patients. However, some  are best treated at concentrations outside of this range. Acetaminophen concentrations >150 ug/mL at 4 hours after ingestion  and >50 ug/mL at 12 hours after ingestion are often associated with  toxic reactions. Performed at [facility], [place] [address], [place], [place] [address]   I-Stat beta hCG blood, ED     Status: None   Collection Time: 12/02/18  4:02 AM  Result Value Ref Range   I-stat hCG, quantitative <5.0 <5 mIU/mL   Comment 3            Comment:   GEST. AGE      CONC.  (mIU/mL)   <=1 WEEK        5 - 50     2 WEEKS       50 - 500     3 WEEKS       100 - 10,000     4 WEEKS     1,000 - 30,000        FEMALE AND NON-PREGNANT FEMALE:     LESS THAN 5 mIU/mL   POC occult blood, ED Provider will collect     Status: None   Collection Time: 12/02/18  4:44 AM  Result Value Ref Range   Fecal Occult Bld NEGATIVE NEGATIVE  Carbamazepine level, total     Status: Abnormal   Collection Time: 12/02/18  5:02 AM  Result Value Ref Range   Carbamazepine Lvl <2.0 (L) 4.0 - 12.0 ug/mL    Comment: Performed at [place] [address], [place], [place] [address]  CK     Status: None   Collection Time: 12/02/18  5:02 AM  Result Value Ref Range   Total CK 110 38 - 234 U/L    Comment: Performed at [facility], [place] [address], [place], [place] [address]  Vitamin B12     Status: None   Collection Time: 12/02/18  5:02 AM  Result Value Ref Range   Vitamin B-12 225 180 - 914 pg/mL    Comment: (NOTE) This assay is not validated for testing neonatal or myeloproliferative syndrome specimens for  Vitamin B12 levels. Performed at [facility], [place] [address], [place], [place] [address]   Folate     Status: None   Collection Time: 12/02/18  5:02 AM  Result Value Ref Range   Folate 69.5 >5.9 ng/mL    Comment: RESULTS CONFIRMED BY MANUAL DILUTION Performed at [facility] [address] [place], [place] [address]   Iron and TIBC     Status: Abnormal   Collection Time: 12/02/18  5:02 AM  Result Value Ref Range   Iron 11 (L) 28 - 170 ug/dL   TIBC 603 (H) 250 - 450 ug/dL   Saturation Ratios 2 (L) 10.4 - 31.8 %   UIBC 592 ug/dL    Comment: Performed at [facility], [place] [address] [place], [place] [address]  Ferritin     Status: Abnormal   Collection Time: 12/02/18  5:02 AM  Result Value Ref Range   Ferritin 3 (L) 11 - 307 ng/mL    Comment: Performed at [facility], [place] [address], [place], [place] [address]  Reticulocytes     Status: Abnormal   Collection Time:  12/02/18  5:02 AM  Result Value Ref Range   Retic Ct Pct 1.2 0.4 - 3.1 %   RBC. 4.90 3.87 - 5.11 MIL/uL   Retic Count, Absolute 56.8 19.0 - 186.0 K/uL   Immature Retic Fract 28.8 (H) 2.3 - 15.9 %    Comment: Performed at [facility], [address] [place], [place] [address]  Magnesium     Status: None   Collection Time: 12/02/18  5:02 AM  Result Value Ref Range   Magnesium 2.4 1.7 - 2.4 mg/dL    Comment: Performed at [facility], [address] [place], [place] [address]  SARS Coronavirus 2 (CEPHEID - Performed in [facility] hospital lab), Hosp Order     Status: None   Collection Time: 12/02/18  5:34 AM   Specimen: Nasopharyngeal Swab  Result Value Ref Range   SARS Coronavirus 2 NEGATIVE NEGATIVE    Comment: (NOTE) If result is NEGATIVE SARS-CoV-2 target nucleic acids are NOT DETECTED. The SARS-CoV-2 RNA is generally detectable in upper and lower  respiratory specimens during the acute phase  of infection. The lowest  concentration of SARS-CoV-2 viral copies this assay can detect is 250  copies / mL. A negative result does not preclude SARS-CoV-2 infection  and should not be used as the sole basis for treatment or other  patient management decisions.  A negative result may occur with  improper specimen collection / handling, submission of specimen other  than nasopharyngeal swab, presence of viral mutation(s) within the  areas targeted by this assay, and inadequate number of viral copies  (<250 copies / mL). A negative result must be combined with clinical  observations, patient history, and epidemiological information. If result is POSITIVE SARS-CoV-2 target nucleic acids are DETECTED. The SARS-CoV-2 RNA is generally detectable in upper and lower  respiratory specimens dur ing the acute phase of infection.  Positive  results are indicative of active infection with SARS-CoV-2.  Clinical  correlation with patient history and other diagnostic information is  necessary to determine patient infection status.  Positive results do  not rule out bacterial infection or co-infection with other viruses. If result is PRESUMPTIVE POSTIVE SARS-CoV-2 nucleic acids MAY BE PRESENT.   A presumptive positive result was obtained on the submitted specimen  and confirmed on repeat testing.  While 2019 novel coronavirus  (SARS-CoV-2) nucleic acids may be present in the submitted sample  additional confirmatory testing may be necessary for epidemiological  and / or clinical management purposes  to differentiate between  SARS-CoV-2 and other Sarbecovirus currently known to infect humans.  If clinically indicated additional testing with an alternate test  methodology [phone]([id number]) is advised. The SARS-CoV-2 RNA is generally  detectable in upper and lower respiratory sp ecimens during the acute  phase of infection. The expected result is Negative. Fact Sheet for Patients:   BoilerBrush.com.cyhttps://www.fda.gov/media/136312/download Fact Sheet for Healthcare Providers: https://pope.com/https://www.fda.gov/media/136313/download This test is not yet approved or cleared by the [place] FDA and has been authorized for detection and/or diagnosis of SARS-CoV-2 by FDA under an Emergency Use Authorization (EUA).  This EUA will remain in effect (meaning this test can be used) for the duration of the COVID-19 declaration under Section 564(b)(1) of the Act, 21 U.S.C. section 360bbb-3(b)(1), unless the authorization is terminated or revoked sooner. Performed at [facility], [address] [place], [place] [address]   Type and screen     Status: None (Preliminary result)   Collection Time: 12/02/18  5:40 AM  Result Value Ref Range   ABO/RH(D) A POS    Antibody Screen NEG    Sample Expiration 12/05/2018,2359    Unit Number [id number]    Blood Component Type RED CELLS,LR    Unit division 00    Status of Unit ALLOCATED    Transfusion Status OK TO TRANSFUSE    Crossmatch Result      Compatible Performed at [facility], [address] [place], [place] [address]   Prepare RBC     Status: None   Collection Time: 12/02/18  6:00 AM  Result Value Ref Range   Order Confirmation      ORDER PROCESSED BY BLOOD BANK Performed at [facility], [address] [place], [place] [address]   ABO/Rh     Status: None (Preliminary result)   Collection Time: 12/02/18  6:00 AM  Result Value Ref Range   ABO/RH(D)      A POS Performed at [facility], [address] [place], [place] [address]   Glucose, capillary     Status: Abnormal   Collection Time: 12/02/18  8:54 AM  Result Value Ref Range   Glucose-Capillary 109 (H) 70 - 99 mg/dL    Medications:  Current Facility-Administered Medications  Medication Dose Route Frequency Provider Last Rate Last Dose  . bisacodyl (DULCOLAX) EC tablet 10 mg  10 mg Oral Daily Calvert Cantorizwan,  Saima, MD   10 mg at 12/02/18 0956  . bisacodyl (DULCOLAX) suppository 10 mg  10 mg Rectal Once Calvert Cantorizwan, Saima, MD      . folic acid (FOLVITE) tablet 1 mg  1 mg Oral Daily Lorretta HarpNiu, Xilin, MD   1 mg at 12/02/18 [id number]  . LORazepam (ATIVAN) injection 0-4 mg  0-4 mg Intravenous Q6H Lorretta HarpNiu, Xilin, MD       Followed by  . [START ON 12/04/2018] LORazepam (ATIVAN) injection 0-4 mg  0-4 mg Intravenous Q12H Lorretta HarpNiu, Xilin, MD      . LORazepam (ATIVAN) tablet 1 mg  1 mg Oral Q6H PRN Lorretta HarpNiu, Xilin, MD       Or  . LORazepam (ATIVAN) injection 1 mg  1 mg Intravenous Q6H PRN Lorretta HarpNiu, Xilin, MD      . multivitamin with minerals tablet 1 tablet  1 tablet Oral Daily Lorretta HarpNiu, Xilin, MD   1 tablet at 12/02/18 0956  . nicotine (NICODERM CQ - dosed in mg/24 hours) patch 21 mg  21 mg Transdermal Daily Lorretta HarpNiu, Xilin, MD   21 mg at 12/02/18 0924  . ondansetron (ZOFRAN) tablet 4 mg  4 mg Oral Q6H PRN Lorretta HarpNiu, Xilin, MD       Or  . ondansetron [facility](ZOFRAN) injection 4 mg  4 mg Intravenous Q6H PRN Lorretta HarpNiu, Xilin, MD      . pantoprazole (PROTONIX) EC tablet 40 mg  40 mg Oral BID Lorretta HarpNiu, Xilin, MD   40 mg at 12/02/18 0921  . senna-docusate (Senokot-S) tablet 2 tablet  2 tablet Oral Daily Calvert Cantorizwan, Saima, MD   2 tablet at 12/02/18 0956  . thiamine (VITAMIN B-1) tablet 100 mg  100 mg Oral Daily Lorretta HarpNiu, Xilin, MD   100 mg at 12/02/18 [id number]   Or  . thiamine (B-1) injection 100 mg  100 mg Intravenous Daily Lorretta HarpNiu, Xilin, MD      . vitamin B-12 (CYANOCOBALAMIN) tablet 500 mcg  500 mcg Oral Daily Lorretta HarpNiu, Xilin, MD   500 mcg at 12/02/18 [id number]    Musculoskeletal: Strength & Muscle Tone: not  tested Gait & Station: not tested Patient leans: N/A  Psychiatric Specialty Exam: Physical Exam  Psychiatric: She has a normal mood and affect. Her speech is normal and behavior is normal. Judgment and thought content normal. Cognition and memory are normal.    Review of Systems  Constitutional: Positive for malaise/fatigue.  HENT: Negative.   Eyes: Negative.   Respiratory: Negative.    Cardiovascular: Negative.   Gastrointestinal: Negative.   Genitourinary: Negative.   Musculoskeletal: Positive for myalgias.  Skin: Negative.   Psychiatric/Behavioral: Negative.     Blood pressure 116/80, pulse 96, temperature 99.1 F (37.3 C), temperature source Oral, resp. rate 16, weight 49.9 kg, SpO2 99 %.Body mass index is 20.78 kg/m.  General Appearance: Casual  Eye Contact:  Good  Speech:  Clear and Coherent  Volume:  Normal  Mood:  Dysphoric  Affect:  Appropriate  Thought Process:  Coherent and Linear  Orientation:  Full (Time, Place, and Person)  Thought Content:  Logical  Suicidal Thoughts:  No  Homicidal Thoughts:  No  Memory:  Immediate;   Good Recent;   Good Remote;   Good  Judgement:  Fair  Insight:  Fair  Psychomotor Activity:  Normal  Concentration:  Concentration: Good and Attention Span: Good  Recall:  Good  Fund of Knowledge:  Good  Language:  Good  Akathisia:  No  Handed:  Right  AIMS (if indicated):     Assets:  Communication Skills Desire for Improvement  ADL's:  Intact  Cognition:  WNL  Sleep:   fair     Treatment Plan Summary: 43 y.o. female with medical history significant of homelessness, GERD, depression, alcohol abuse, tobacco abuse, Barrett's esophagitis, who presents with homeless issue, generalized weakness, dark stool,suicidal ideation and depression ongoing since patient became homeless. Today, patient denies suicidal ideation, intent or plan. She refused antidepressant but willing to be referred for outpatient counseling.  Recommendations: -Consider social worker consult to facilitate referral to outpatient counselor upon discharge -Psychiatric service signing out. Re-consult as needed  Disposition: No evidence of imminent risk to self or others at present.   Patient does not meet criteria for psychiatric inpatient admission. Supportive therapy provided about ongoing stressors.  This service was provided via telemedicine using a  2-way, interactive audio and video technology.  Names of all persons participating in this telemedicine service and their role in this encounter. Name: Sudie Bailey Role: patient  Name: Thedore Mins, MD Role: psychiatrist  Name: Deon Pilling Role: RN   Thedore Mins, MD 12/02/2018 1:14 PM

## 2018-12-03 DIAGNOSIS — K22719 Barrett's esophagus with dysplasia, unspecified: Secondary | ICD-10-CM

## 2018-12-03 DIAGNOSIS — K2270 Barrett's esophagus without dysplasia: Secondary | ICD-10-CM

## 2018-12-03 LAB — BASIC METABOLIC PANEL
Anion gap: 8 (ref 5–15)
BUN: 11 mg/dL (ref 6–20)
CO2: 19 mmol/L — ABNORMAL LOW (ref 22–32)
Calcium: 9 mg/dL (ref 8.9–10.3)
Chloride: 108 mmol/L (ref 98–111)
Creatinine, Ser: 0.53 mg/dL (ref 0.44–1.00)
GFR calc Af Amer: >60 mL/min (ref >60)
GFR calc non Af Amer: >60 mL/min (ref >60)
Glucose, Bld: 94 mg/dL (ref 70–99)
Potassium: 4.4 mmol/L (ref 3.5–5.1)
Sodium: 135 mmol/L (ref 135–145)

## 2018-12-03 LAB — BPAM RBC
Blood Product Expiration Date: 202008062359
ISSUE DATE / TIME: 202007121619
Unit Type and Rh: 6200

## 2018-12-03 LAB — CBC
HCT: 34 % — ABNORMAL LOW (ref 36.0–46.0)
Hemoglobin: 9 g/dL — ABNORMAL LOW (ref 12.0–15.0)
MCH: 18.3 pg — ABNORMAL LOW (ref 26.0–34.0)
MCHC: 26.5 g/dL — ABNORMAL LOW (ref 30.0–36.0)
MCV: 69.1 fL — ABNORMAL LOW (ref 80.0–100.0)
Platelets: 409 10*3/uL — ABNORMAL HIGH (ref 150–400)
RBC: 4.92 MIL/uL (ref 3.87–5.11)
RDW: 24.2 % — ABNORMAL HIGH (ref 11.5–15.5)
WBC: 6 10*3/uL (ref 4.0–10.5)
nRBC: 0 % (ref 0.0–0.2)

## 2018-12-03 LAB — TYPE AND SCREEN
ABO/RH(D): A POS
Antibody Screen: NEGATIVE
Unit division: 0

## 2018-12-03 LAB — HIV ANTIBODY (ROUTINE TESTING W REFLEX): HIV Screen 4th Generation wRfx: NONREACTIVE

## 2018-12-03 MED ORDER — BISACODYL 10 MG RE SUPP: 10.0000 mg | RECTAL | 0 refills | Status: DC | PRN

## 2018-12-03 MED ORDER — FERROUS GLUCONATE 324 (38 FE) MG PO TABS
324.0000 mg | ORAL_TABLET | Freq: Two times a day (BID) | ORAL | Status: DC
  Filled 2018-12-03 (×2): qty 1

## 2018-12-03 MED ORDER — FERROUS GLUCONATE 324 (38 FE) MG PO TABS: 324.0000 mg | ORAL_TABLET | Freq: Two times a day (BID) | ORAL | 3 refills | Status: DC

## 2018-12-03 MED ORDER — SENNOSIDES-DOCUSATE SODIUM 8.6-50 MG PO TABS: 2.0000 | ORAL_TABLET | Freq: Every day | ORAL | 3 refills | Status: DC

## 2018-12-03 MED ORDER — DEXILANT 60 MG PO CPDR: 60.0000 mg | DELAYED_RELEASE_CAPSULE | Freq: Every day | ORAL | 3 refills | Status: DC

## 2018-12-03 MED ORDER — VITAMIN B-12 500 MCG PO TABS: 500.0000 ug | ORAL_TABLET | Freq: Every day | ORAL | 3 refills | Status: DC

## 2018-12-03 MED ORDER — BISACODYL 5 MG PO TBEC: 10.0000 mg | DELAYED_RELEASE_TABLET | Freq: Every day | ORAL | 3 refills | Status: DC | PRN

## 2018-12-03 MED ORDER — CYANOCOBALAMIN 1000 MCG PO TABS: 1000.0000 ug | ORAL_TABLET | Freq: Every day | ORAL | 0 refills | Status: AC

## 2018-12-03 MED ORDER — CYANOCOBALAMIN 1000 MCG/ML IJ SOLN
1000.0000 ug | Freq: Once | INTRAMUSCULAR | Status: DC
  Filled 2018-12-03: qty 1

## 2018-12-03 MED ORDER — FLEET ENEMA 7-19 GM/118ML RE ENEM: 1.0000 | ENEMA | Freq: Once | RECTAL | Status: DC

## 2018-12-03 MED ORDER — VITAMIN B-12 1000 MCG PO TABS: 1000.0000 ug | ORAL_TABLET | Freq: Every day | ORAL | Status: DC

## 2018-12-03 NOTE — Discharge Summary (Signed)
Physician Discharge Summary  Melody CarolinaStacey M Robbins HYQ:[id number] DOB: May 02, 1976 DOA: 12/02/2018  PCP: Patient, No Pcp Per  Admit date: 12/02/2018 Discharge date: 12/03/2018  Admitted From: homeless  Disposition:  same   Recommendations for Outpatient Follow-up:  1. Referred to [facility]- needs CBC and Iron levels in 1 month   Discharge Condition:  stable   CODE STATUS:  Full code   Consultations:  psychiatry    Discharge Diagnoses:  Principal Problem:   Symptomatic anemia Active Problems:   Microcytic anemia   Barrett esophagus   Tobacco abuse   Homeless   GERD (gastroesophageal reflux disease)   Depression   Suicidal ideation   Hypokalemia   Adjustment disorder with depressed mood     Brief Summary: Melody Alexander is a 43 y/o homeless female with a h/o Barrettes esophagus/ GERD, depression, alcohol abuse, tobacco abuse who presents with generalize weakness and is found to be anemic. She also is depressed and has suicidal ideation.  She admits to occasionally vomiting up blood has bas not had hematemesis in > 1 wk. She states that it is usually a small amount of what looks like old blood and typically is not severe. She also has problems with severe constipation.   Hospital Course:  Principal Problem:   Symptomatic anemia- Iron deficiency - from chronic blood loss anemia related to GERD and Barret's esophagus -  transfused 1 U PRBC with noted improvement in Hb from 7.5 to 9.0 -   also infused IV Iron as she is severely iron deficient - as she is currently not bleeding, and thus there was no not need to call GI- she needs continued surveillance of her Barret's esophagus -she will need to be placed on oral Iron- I have started Ferrous Gluconate  - B12 is low normal and thus I have increased her from 500 mcg to 1000 mcg daily as well  Iron      11         Iron    UIBC      592            TIBC      603            Saturation Ratios      2            Ferritin      3             Folate      69.5             Vitamin B12      225     Active Problems:  GERD/ Barret's esophagus - cont PPI-  Severe constipation - have ordered Dulcolax suppository, Senna and oral Dulcolax- she is aware that the oral iron can make her more constipated and she needs to regularly take laxatives    Tobacco abuse - advised to stop smoking    Depression   Suicidal ideation -   psych consult completed- she denied being suicidal when asked by the psychiatrist- she denies needing any medications and will be recommended to f/u with a therapist     Hypokalemia - replaced   homeless - request social work consult to assist with this     Discharge Exam: Vitals:   12/02/18 2333 12/03/18 0626  BP: 116/78 100/62  Pulse: (!) 101 89  Resp: 18 19  Temp: 99.1 F (37.3 C) 97.9 F (36.6 C)  SpO2: 99% 99%   Vitals:  12/02/18 1639 12/02/18 1916 12/02/18 2333 12/03/18 0626  BP: 106/81 134/82 116/78 100/62  Pulse: 91 89 (!) 101 89  Resp: 20 20 18 19   Temp: 98.4 F (36.9 C)  99.1 F (37.3 C) 97.9 F (36.6 C)  TempSrc: Oral  Oral Oral  SpO2: 100% 100% 99% 99%  Weight:      Height:        General: Pt is alert, awake, not in acute distress Cardiovascular: RRR, S1/S2 +, no rubs, no gallops Respiratory: CTA bilaterally, no wheezing, no rhonchi Abdominal: Soft, NT, ND, bowel sounds + Extremities: no edema, no cyanosis   Discharge Instructions  Discharge Instructions    Diet - low sodium heart healthy   Complete by: As directed    Increase activity slowly   Complete by: As directed      Allergies as of 12/03/2018      Reactions   Dilaudid [hydromorphone Hcl] Hives   Norco [hydrocodone-acetaminophen] Hives      Medication List    STOP taking these medications   esomeprazole 40 MG capsule Commonly known as: NEXIUM   mupirocin ointment 2 % Commonly known as: Bactroban     TAKE these medications   bisacodyl 5 MG EC tablet Commonly known as:  DULCOLAX Take 2 tablets (10 mg total) by mouth daily as needed for moderate constipation.   bisacodyl 10 MG suppository Commonly known as: Dulcolax Place 1 suppository (10 mg total) rectally as needed for moderate constipation.   cyanocobalamin 1000 MCG tablet Take 1 tablet (1,000 mcg total) by mouth daily. What changed:   medication strength  how much to take  additional instructions   Dexilant 60 MG capsule Generic drug: dexlansoprazole Take 1 capsule (60 mg total) by mouth daily.   ferrous gluconate 324 MG tablet Commonly known as: FERGON Take 1 tablet (324 mg total) by mouth 2 (two) times daily with a meal.   senna-docusate 8.6-50 MG tablet Commonly known as: Senokot-S Take 2 tablets by mouth daily.       Allergies  Allergen Reactions  . Dilaudid [Hydromorphone Hcl] Hives  . Norco [Hydrocodone-Acetaminophen] Hives     Procedures/Studies:    No results found.   The results of significant diagnostics from this hospitalization (including imaging, microbiology, ancillary and laboratory) are listed below for reference.     Microbiology: Recent Results (from the past 240 hour(s))  SARS Coronavirus 2 (CEPHEID - Performed in [place] hospital lab), Hosp Order     Status: None   Collection Time: 12/02/18  5:34 AM   Specimen: Nasopharyngeal Swab  Result Value Ref Range Status   SARS Coronavirus 2 NEGATIVE NEGATIVE Final    Comment: (NOTE) If result is NEGATIVE SARS-CoV-2 target nucleic acids are NOT DETECTED. The SARS-CoV-2 RNA is generally detectable in upper and lower  respiratory specimens during the acute phase of infection. The lowest  concentration of SARS-CoV-2 viral copies this assay can detect is 250  copies / mL. A negative result does not preclude SARS-CoV-2 infection  and should not be used as the sole basis for treatment or other  patient management decisions.  A negative result may occur with  improper specimen collection / handling,  submission of specimen other  than nasopharyngeal swab, presence of viral mutation(s) within the  areas targeted by this assay, and inadequate number of viral copies  (<250 copies / mL). A negative result must be combined with clinical  observations, patient history, and epidemiological information. If result is POSITIVE SARS-CoV-2 target  nucleic acids are DETECTED. The SARS-CoV-2 RNA is generally detectable in upper and lower  respiratory specimens dur ing the acute phase of infection.  Positive  results are indicative of active infection with SARS-CoV-2.  Clinical  correlation with patient history and other diagnostic information is  necessary to determine patient infection status.  Positive results do  not rule out bacterial infection or co-infection with other viruses. If result is PRESUMPTIVE POSTIVE SARS-CoV-2 nucleic acids MAY BE PRESENT.   A presumptive positive result was obtained on the submitted specimen  and confirmed on repeat testing.  While 2019 novel coronavirus  (SARS-CoV-2) nucleic acids may be present in the submitted sample  additional confirmatory testing may be necessary for epidemiological  and / or clinical management purposes  to differentiate between  SARS-CoV-2 and other Sarbecovirus currently known to infect humans.  If clinically indicated additional testing with an alternate test  methodology [phone]([id number]) is advised. The SARS-CoV-2 RNA is generally  detectable in upper and lower respiratory sp ecimens during the acute  phase of infection. The expected result is Negative. Fact Sheet for Patients:  BoilerBrush.com.cyhttps://www.fda.gov/media/136312/download Fact Sheet for Healthcare Providers: https://pope.com/https://www.fda.gov/media/136313/download This test is not yet approved or cleared by the [place] FDA and has been authorized for detection and/or diagnosis of SARS-CoV-2 by FDA under an Emergency Use Authorization (EUA).  This EUA will remain in effect (meaning this test can be  used) for the duration of the COVID-19 declaration under Section 564(b)(1) of the Act, 21 U.S.C. section 360bbb-3(b)(1), unless the authorization is terminated or revoked sooner. Performed at [facility], [address] [place], [place] [address]      Labs: BNP (last 3 results) No results for input(s): BNP in the last 8760 hours. Basic Metabolic Panel: Recent Labs  Lab 12/02/18 0353 12/02/18 0502 12/03/18 0625  NA 137  --  135  K 3.3*  --  4.4  CL 104  --  108  CO2 23  --  19*  GLUCOSE 83  --  94  BUN 13  --  11  CREATININE 0.51  --  0.53  CALCIUM 8.9  --  9.0  MG  --  2.4  --    Liver Function Tests: Recent Labs  Lab 12/02/18 0353  AST 23  ALT 15  ALKPHOS 61  BILITOT 0.1*  PROT 7.7  ALBUMIN 4.0   No results for input(s): LIPASE, AMYLASE in the last 168 hours. No results for input(s): AMMONIA in the last 168 hours. CBC: Recent Labs  Lab 12/02/18 0353 12/03/18 0625  WBC 6.1 6.0  HGB 7.5* 9.0*  HCT 31.5* 34.0*  MCV 66.0* 69.1*  PLT 467* 409*   Cardiac Enzymes: Recent Labs  Lab 12/02/18 0502  CKTOTAL 110   BNP: Invalid input(s): POCBNP CBG: Recent Labs  Lab 12/02/18 0854  GLUCAP 109*   D-Dimer No results for input(s): DDIMER in the last 72 hours. Hgb A1c No results for input(s): HGBA1C in the last 72 hours. Lipid Profile No results for input(s): CHOL, HDL, LDLCALC, TRIG, CHOLHDL, LDLDIRECT in the last 72 hours. Thyroid function studies No results for input(s): TSH, T4TOTAL, T3FREE, THYROIDAB in the last 72 hours.  Invalid input(s): FREET3 Anemia work up Recent Labs    12/02/18 0502  VITAMINB12 225  FOLATE 69.5  FERRITIN 3*  TIBC 603*  IRON 11*  RETICCTPCT 1.2   Urinalysis    Component Value Date/Time   COLORURINE YELLOW 02/11/2011 1604   APPEARANCEUR CLEAR 02/11/2011 1604  LABSPEC 1.018 02/11/2011 1604   PHURINE 7.0 02/11/2011 1604   GLUCOSEU NEGATIVE 02/11/2011 1604   HGBUR NEGATIVE 02/11/2011 1604    BILIRUBINUR NEGATIVE 02/11/2011 1604   KETONESUR 15 (A) 02/11/2011 1604   PROTEINUR NEGATIVE 02/11/2011 1604   UROBILINOGEN 0.2 02/11/2011 1604   NITRITE NEGATIVE 02/11/2011 1604   LEUKOCYTESUR SMALL (A) 02/11/2011 1604   Sepsis Labs Invalid input(s): PROCALCITONIN,  WBC,  LACTICIDVEN Microbiology Recent Results (from the past 240 hour(s))  SARS Coronavirus 2 (CEPHEID - Performed in [facility] hospital lab), Hosp Order     Status: None   Collection Time: 12/02/18  5:34 AM   Specimen: Nasopharyngeal Swab  Result Value Ref Range Status   SARS Coronavirus 2 NEGATIVE NEGATIVE Final    Comment: (NOTE) If result is NEGATIVE SARS-CoV-2 target nucleic acids are NOT DETECTED. The SARS-CoV-2 RNA is generally detectable in upper and lower  respiratory specimens during the acute phase of infection. The lowest  concentration of SARS-CoV-2 viral copies this assay can detect is 250  copies / mL. A negative result does not preclude SARS-CoV-2 infection  and should not be used as the sole basis for treatment or other  patient management decisions.  A negative result may occur with  improper specimen collection / handling, submission of specimen other  than nasopharyngeal swab, presence of viral mutation(s) within the  areas targeted by this assay, and inadequate number of viral copies  (<250 copies / mL). A negative result must be combined with clinical  observations, patient history, and epidemiological information. If result is POSITIVE SARS-CoV-2 target nucleic acids are DETECTED. The SARS-CoV-2 RNA is generally detectable in upper and lower  respiratory specimens dur ing the acute phase of infection.  Positive  results are indicative of active infection with SARS-CoV-2.  Clinical  correlation with patient history and other diagnostic information is  necessary to determine patient infection status.  Positive results do  not rule out bacterial infection or co-infection with other viruses. If  result is PRESUMPTIVE POSTIVE SARS-CoV-2 nucleic acids MAY BE PRESENT.   A presumptive positive result was obtained on the submitted specimen  and confirmed on repeat testing.  While 2019 novel coronavirus  (SARS-CoV-2) nucleic acids may be present in the submitted sample  additional confirmatory testing may be necessary for epidemiological  and / or clinical management purposes  to differentiate between  SARS-CoV-2 and other Sarbecovirus currently known to infect humans.  If clinically indicated additional testing with an alternate test  methodology [phone]([id number]) is advised. The SARS-CoV-2 RNA is generally  detectable in upper and lower respiratory sp ecimens during the acute  phase of infection. The expected result is Negative. Fact Sheet for Patients:  BoilerBrush.com.cyhttps://www.fda.gov/media/136312/download Fact Sheet for Healthcare Providers: https://pope.com/https://www.fda.gov/media/136313/download This test is not yet approved or cleared by the [place] FDA and has been authorized for detection and/or diagnosis of SARS-CoV-2 by FDA under an Emergency Use Authorization (EUA).  This EUA will remain in effect (meaning this test can be used) for the duration of the COVID-19 declaration under Section 564(b)(1) of the Act, 21 U.S.C. section 360bbb-3(b)(1), unless the authorization is terminated or revoked sooner. Performed at [facility], [address] [place], [place] [address]      Time coordinating discharge in minutes: 65  SIGNED:   Calvert CantorSaima Daksh Coates, MD  [facility] 12/03/2018, 12:15 PM Pager   If 7PM-7AM, please contact night-coverage [email or web address] Password [email or web address]
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
## 2019-01-14 ENCOUNTER — Other Ambulatory Visit: Payer: Self-pay

## 2019-01-14 ENCOUNTER — Encounter (HOSPITAL_COMMUNITY): Payer: Self-pay | Admitting: Emergency Medicine

## 2019-01-14 ENCOUNTER — Emergency Department (HOSPITAL_COMMUNITY)
Admission: EM | Admit: 2019-01-14 | Discharge: 2019-01-14 | Disposition: A | Discharge status: Home / Self Care | Payer: Medicaid Other | Attending: Emergency Medicine | Admitting: Emergency Medicine

## 2019-01-14 DIAGNOSIS — F17210 Nicotine dependence, cigarettes, uncomplicated: Secondary | ICD-10-CM | POA: Insufficient documentation

## 2019-01-14 DIAGNOSIS — Z79899 Other long term (current) drug therapy: Secondary | ICD-10-CM | POA: Insufficient documentation

## 2019-01-14 DIAGNOSIS — R1013 Epigastric pain: Secondary | ICD-10-CM | POA: Insufficient documentation

## 2019-01-14 DIAGNOSIS — R112 Nausea with vomiting, unspecified: Secondary | ICD-10-CM | POA: Insufficient documentation

## 2019-01-14 LAB — CBC WITH DIFFERENTIAL/PLATELET
Abs Immature Granulocytes: 0.04 10*3/uL (ref 0.00–0.07)
Basophils Absolute: 0.1 10*3/uL (ref 0.0–0.1)
Basophils Relative: 1 %
Eosinophils Absolute: 0.1 10*3/uL (ref 0.0–0.5)
Eosinophils Relative: 1 %
HCT: 35.9 % — ABNORMAL LOW (ref 36.0–46.0)
Hemoglobin: 10.2 g/dL — ABNORMAL LOW (ref 12.0–15.0)
Immature Granulocytes: 0 %
Lymphocytes Relative: 18 %
Lymphs Abs: 2.2 10*3/uL (ref 0.7–4.0)
MCH: 23 pg — ABNORMAL LOW (ref 26.0–34.0)
MCHC: 28.4 g/dL — ABNORMAL LOW (ref 30.0–36.0)
MCV: 80.9 fL (ref 80.0–100.0)
Monocytes Absolute: 0.8 10*3/uL (ref 0.1–1.0)
Monocytes Relative: 7 %
Neutro Abs: 9 10*3/uL — ABNORMAL HIGH (ref 1.7–7.7)
Neutrophils Relative %: 73 %
Platelets: 507 10*3/uL — ABNORMAL HIGH (ref 150–400)
RBC: 4.44 MIL/uL (ref 3.87–5.11)
RDW: 24.4 % — ABNORMAL HIGH (ref 11.5–15.5)
WBC: 12.1 10*3/uL — ABNORMAL HIGH (ref 4.0–10.5)
nRBC: 0 % (ref 0.0–0.2)

## 2019-01-14 LAB — PROTIME-INR
INR: 0.9 (ref 0.8–1.2)
Prothrombin Time: 12.4 seconds (ref 11.4–15.2)

## 2019-01-14 LAB — COMPREHENSIVE METABOLIC PANEL
ALT: 13 U/L (ref 0–44)
AST: 17 U/L (ref 15–41)
Albumin: 3.5 g/dL (ref 3.5–5.0)
Alkaline Phosphatase: 69 U/L (ref 38–126)
Anion gap: 9 (ref 5–15)
BUN: 11 mg/dL (ref 6–20)
CO2: 25 mmol/L (ref 22–32)
Calcium: 9.6 mg/dL (ref 8.9–10.3)
Chloride: 102 mmol/L (ref 98–111)
Creatinine, Ser: 0.51 mg/dL (ref 0.44–1.00)
GFR calc Af Amer: >60 mL/min (ref >60)
GFR calc non Af Amer: >60 mL/min (ref >60)
Glucose, Bld: 93 mg/dL (ref 70–99)
Potassium: 3.3 mmol/L — ABNORMAL LOW (ref 3.5–5.1)
Sodium: 136 mmol/L (ref 135–145)
Total Bilirubin: 0.6 mg/dL (ref 0.3–1.2)
Total Protein: 7.1 g/dL (ref 6.5–8.1)

## 2019-01-14 LAB — POC OCCULT BLOOD, ED: Fecal Occult Bld: NEGATIVE

## 2019-01-14 LAB — TYPE AND SCREEN
ABO/RH(D): A POS
Antibody Screen: NEGATIVE

## 2019-01-14 LAB — I-STAT BETA HCG BLOOD, ED (MC, WL, AP ONLY): I-stat hCG, quantitative: <5 m[IU]/mL (ref <5)

## 2019-01-14 LAB — LIPASE, BLOOD: Lipase: 42 U/L (ref 11–51)

## 2019-01-14 MED ORDER — SUCRALFATE 1 GM/10ML PO SUSP: 1.0000 g | Freq: Three times a day (TID) | ORAL | 0 refills | Status: AC

## 2019-01-14 MED ORDER — SODIUM CHLORIDE 0.9 % IV SOLN
INTRAVENOUS | Status: DC
  Administered 2019-01-14: 11:00:00 via INTRAVENOUS

## 2019-01-14 MED ORDER — ALUM & MAG HYDROXIDE-SIMETH 200-200-20 MG/5ML PO SUSP
30.0000 mL | Freq: Once | ORAL | Status: AC
  Administered 2019-01-14: 30 mL via ORAL
  Filled 2019-01-14: qty 30

## 2019-01-14 MED ORDER — FENTANYL CITRATE (PF) 100 MCG/2ML IJ SOLN
25.0000 ug | Freq: Once | INTRAMUSCULAR | Status: AC
  Administered 2019-01-14: 10:00:00 25 ug via INTRAVENOUS
  Filled 2019-01-14: qty 2

## 2019-01-14 MED ORDER — PANTOPRAZOLE SODIUM 40 MG PO TBEC: 40.0000 mg | DELAYED_RELEASE_TABLET | Freq: Every day | ORAL | 0 refills | Status: AC

## 2019-01-14 MED ORDER — SODIUM CHLORIDE 0.9 % IV BOLUS
1000.0000 mL | Freq: Once | INTRAVENOUS | Status: AC
  Administered 2019-01-14: 10:00:00 1000 mL via INTRAVENOUS

## 2019-01-14 MED ORDER — LIDOCAINE VISCOUS HCL 2 % MT SOLN
15.0000 mL | Freq: Once | OROMUCOSAL | Status: AC
  Administered 2019-01-14: 12:00:00 15 mL via ORAL
  Filled 2019-01-14: qty 15

## 2019-01-14 MED ORDER — SODIUM CHLORIDE 0.9 % IV SOLN
80.0000 mg | Freq: Once | INTRAVENOUS | Status: AC
  Administered 2019-01-14: 80 mg via INTRAVENOUS
  Filled 2019-01-14: qty 80

## 2019-01-14 MED ORDER — SODIUM CHLORIDE 0.9 % IV SOLN
8.0000 mg/h | INTRAVENOUS | Status: DC
  Administered 2019-01-14: 11:00:00 8 mg/h via INTRAVENOUS
  Filled 2019-01-14: qty 80

## 2019-01-14 MED ORDER — ONDANSETRON 4 MG PO TBDP: 4.0000 mg | ORAL_TABLET | Freq: Three times a day (TID) | ORAL | 0 refills | Status: AC | PRN

## 2019-01-14 MED ORDER — ONDANSETRON HCL 4 MG/2ML IJ SOLN
4.0000 mg | Freq: Once | INTRAMUSCULAR | Status: AC
  Administered 2019-01-14: 10:00:00 4 mg via INTRAVENOUS
  Filled 2019-01-14: qty 2

## 2019-01-14 NOTE — ED Notes (Signed)
Pt reports bleeding from esophagus. Pt reports hematemesis and black stool. Pt states she was told she would need surgery for bleeding esophagus.

## 2019-01-14 NOTE — ED Notes (Signed)
An After Visit Summary was printed and given to the patient. Discharge instructions given and no further questions at this time, prescriptions given to patient.

## 2019-01-14 NOTE — Discharge Instructions (Signed)
You were seen in the ER today for vomiting blood.  Your labs show that your hemoglobin is 10.2 which is improved from prior on record.  We would like you to take the following medicines: = Protonix: Take this daily prior to eating anything -Carafate: Take this before meals and before bedtime -Zofran: Take every 8 hours as needed for nausea and vomiting.  We have prescribed you new medication(s) today. Discuss the medications prescribed today with your pharmacist as they can have adverse effects and interactions with your other medicines including over the counter and prescribed medications. Seek medical evaluation if you start to experience new or abnormal symptoms after taking one of these medicines, seek care immediately if you start to experience difficulty breathing, feeling of your throat closing, facial swelling, or rash as these could be indications of a more serious allergic reaction  We like you to follow-up very closely with gastroenterology, we have given you low our GIs information your discharge instructions, we have also placed a referral so that their office may call you to schedule follow-up.  Return to the ER for new or worsening symptoms including but not limited to dark/tarry stools, bright red blood in your stools, vomiting blood, passing out, or any other concerns. concerns.
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
## 2019-06-25 ENCOUNTER — Other Ambulatory Visit: Payer: Self-pay | Admitting: Radiology
